# Patient Record
Sex: Female | Born: 1980 | Race: White | Hispanic: No | Marital: Single | Smoking: Never smoker
Health system: Southern US, Community
[De-identification: ages and names within clinical notes are randomized; demographics above are authoritative.]

## PROBLEM LIST (undated history)

## (undated) DIAGNOSIS — J45909 Unspecified asthma, uncomplicated: Secondary | ICD-10-CM

## (undated) DIAGNOSIS — E282 Polycystic ovarian syndrome: Secondary | ICD-10-CM

## (undated) DIAGNOSIS — T7840XA Allergy, unspecified, initial encounter: Secondary | ICD-10-CM

## (undated) DIAGNOSIS — G43909 Migraine, unspecified, not intractable, without status migrainosus: Secondary | ICD-10-CM

## (undated) HISTORY — DX: Polycystic ovarian syndrome: E28.2

## (undated) HISTORY — DX: Migraine, unspecified, not intractable, without status migrainosus: G43.909

## (undated) HISTORY — PX: WISDOM TOOTH EXTRACTION: SHX21

## (undated) HISTORY — DX: Allergy, unspecified, initial encounter: T78.40XA

## (undated) HISTORY — DX: Unspecified asthma, uncomplicated: J45.909

---

## 2011-03-28 ENCOUNTER — Emergency Department (HOSPITAL_COMMUNITY)
Admission: EM | Admit: 2011-03-28 | Discharge: 2011-03-29 | Disposition: A | Discharge status: Home / Self Care | Payer: BC Managed Care – PPO | Attending: Emergency Medicine | Admitting: Emergency Medicine

## 2011-03-28 DIAGNOSIS — L500 Allergic urticaria: Secondary | ICD-10-CM | POA: Insufficient documentation

## 2011-03-28 DIAGNOSIS — R6889 Other general symptoms and signs: Secondary | ICD-10-CM | POA: Insufficient documentation

## 2011-03-28 DIAGNOSIS — L298 Other pruritus: Secondary | ICD-10-CM | POA: Insufficient documentation

## 2011-03-28 DIAGNOSIS — L2989 Other pruritus: Secondary | ICD-10-CM | POA: Insufficient documentation

## 2011-07-06 ENCOUNTER — Ambulatory Visit (INDEPENDENT_AMBULATORY_CARE_PROVIDER_SITE_OTHER): Payer: BC Managed Care – PPO

## 2011-07-06 DIAGNOSIS — R1013 Epigastric pain: Secondary | ICD-10-CM

## 2011-07-06 DIAGNOSIS — R1084 Generalized abdominal pain: Secondary | ICD-10-CM

## 2011-08-13 ENCOUNTER — Ambulatory Visit (INDEPENDENT_AMBULATORY_CARE_PROVIDER_SITE_OTHER): Payer: BC Managed Care – PPO

## 2011-08-13 DIAGNOSIS — R059 Cough, unspecified: Secondary | ICD-10-CM

## 2011-08-13 DIAGNOSIS — J069 Acute upper respiratory infection, unspecified: Secondary | ICD-10-CM

## 2011-08-13 DIAGNOSIS — R05 Cough: Secondary | ICD-10-CM

## 2011-08-13 DIAGNOSIS — N760 Acute vaginitis: Secondary | ICD-10-CM

## 2012-03-24 ENCOUNTER — Ambulatory Visit (INDEPENDENT_AMBULATORY_CARE_PROVIDER_SITE_OTHER): Payer: BC Managed Care – PPO | Admitting: Physician Assistant

## 2012-03-24 VITALS — BP 134/85 | HR 109 | Temp 97.3°F | Resp 16 | Ht 63.0 in | Wt 145.0 lb

## 2012-03-24 DIAGNOSIS — E282 Polycystic ovarian syndrome: Secondary | ICD-10-CM

## 2012-03-24 DIAGNOSIS — N898 Other specified noninflammatory disorders of vagina: Secondary | ICD-10-CM

## 2012-03-24 LAB — POCT WET PREP WITH KOH: Trichomonas, UA: NEGATIVE

## 2012-03-24 MED ORDER — AMBULATORY NON FORMULARY MEDICATION: 1.0000 | Status: DC

## 2012-03-24 MED ORDER — TERCONAZOLE 0.4 % VA CREA: 1.0000 | TOPICAL_CREAM | Freq: Every day | VAGINAL | Status: DC

## 2012-03-24 MED ORDER — CLINDAMYCIN PHOSPHATE 2 % VA CREA: 1.0000 | TOPICAL_CREAM | Freq: Every day | VAGINAL | Status: AC

## 2012-03-24 NOTE — Progress Notes (Signed)
  Subjective:    Patient ID: Daisy Miller, female    DOB: [birth year], 31 y.o.   MRN: [id number]  HPI Pt presents to clinic with vaginal d/c and odor.  Regular gyn is Dr. Chevis Pretty who has been working with her about PCOS and problems with oral contraceptives and depression.  She had a Skyla IUD placed in 2/13 and has had spotting since its insertion.  She has had multiple episodes of BV and yeast vaginitis.  Her last episode of these was about 3 wks ago.  She now has vaginal d/c and odor.  No vaginal irritated or pain. She is not and has never been sexually active.   Review of Systems  Genitourinary: Positive for vaginal bleeding and vaginal discharge. Negative for dysuria, urgency, difficulty urinating and vaginal pain.       Objective:   Physical Exam  Vitals reviewed. Constitutional: She is oriented to person, place, and time. She appears well-developed and well-nourished.  HENT:  Head: Normocephalic and atraumatic.  Right Ear: External ear normal.  Left Ear: External ear normal.  Pulmonary/Chest: Effort normal.  Genitourinary: Pelvic exam was performed with patient supine. No labial fusion. There is no rash, tenderness, lesion or injury on the right labia. There is no rash, tenderness, lesion or injury on the left labia. Cervix exhibits no motion tenderness and no discharge. No bleeding around the vagina. Vaginal discharge (white) found.  Neurological: She is alert and oriented to person, place, and time.  Skin: Skin is warm and dry.  Psychiatric: She has a normal mood and affect. Her behavior is normal. Judgment and thought content normal.    Results for orders placed in visit on 03/24/12  POCT WET PREP WITH KOH      Component Value Range   Trichomonas, UA Negative     Clue Cells Wet Prep HPF POC 2-4     Epithelial Wet Prep HPF POC 8-10     Yeast Wet Prep HPF POC negative     Bacteria Wet Prep HPF POC 1+     RBC Wet Prep HPF POC 0-2     WBC Wet Prep HPF POC 1-4     KOH Prep POC  Negative           1. Vaginal Discharge  POCT Wet Prep with KOH, terconazole (TERAZOL 7) 0.4 % vaginal cream, clindamycin (CLEOCIN) 2 % vaginal cream, AMBULATORY NON FORMULARY MEDICATION, DISCONTINUED: AMBULATORY NON FORMULARY MEDICATION   D/w pt several possibilities for treatment.  Pt will call Dr. Chevis Pretty tomorrow with results of wet prep and different medication choices and together they will make the decision due to her long h/o of these problems.  I really think that Boric Acid might help because I think that this is related to her spotting 2nd to the [facility] IUD and her pH changing as a result of that.  Pt's questions answered and she understands and agrees with the above. Assessment & Plan:

## 2012-06-25 ENCOUNTER — Ambulatory Visit (INDEPENDENT_AMBULATORY_CARE_PROVIDER_SITE_OTHER): Payer: BC Managed Care – PPO | Admitting: Family Medicine

## 2012-06-25 VITALS — BP 114/78 | HR 114 | Temp 97.9°F | Resp 18 | Ht 65.75 in | Wt 142.0 lb

## 2012-06-25 DIAGNOSIS — N898 Other specified noninflammatory disorders of vagina: Secondary | ICD-10-CM

## 2012-06-25 LAB — POCT WET PREP WITH KOH
Clue Cells Wet Prep HPF POC: NEGATIVE
Trichomonas, UA: NEGATIVE
Yeast Wet Prep HPF POC: NEGATIVE

## 2012-06-25 MED ORDER — CLINDAMYCIN PHOSPHATE 2 % VA CREA: 1.0000 | TOPICAL_CREAM | Freq: Every day | VAGINAL | Status: DC

## 2012-06-25 NOTE — Progress Notes (Signed)
  Subjective:    Patient ID: Daisy Miller, female    DOB: 1981-06-24, 31 y.o.   MRN: [id number]  HPI Pt presents to clinic with vaginal d/c and odor.  Was treated 3 months ago for similar type problem. Patient was also treated by her primary gynecologist recently for similar problem. Patient states that this was approximately 3 weeks ago.   Regular gyn is Dr. Chevis Pretty who has been working with her about PCOS and problems with oral contraceptives and depression.  She had a Skyla IUD placed in 2/13 and has had spotting since its insertion.  She has had multiple episodes of BV and yeast vaginitis.  Her last episode of these was about 3 wks ago as stated above.  She now has vaginal d/c and odor.  No vaginal irritated or pain. She is not and has never been sexually active.   Review of Systems  Genitourinary: Positive for vaginal bleeding and vaginal discharge. Negative for dysuria, urgency, difficulty urinating and vaginal pain.      Objective:   Physical Exam  Vitals reviewed. Constitutional: She is oriented to person, place, and time. She appears well-developed and well-nourished.  HENT:  Head: Normocephalic and atraumatic.  Right Ear: External ear normal.  Left Ear: External ear normal.  Pulmonary/Chest: Effort normal.  Genitourinary: Pelvic exam was performed with patient supine. No labial fusion. There is no rash, tenderness, lesion or injury on the right labia. There is no rash, tenderness, lesion or injury on the left labia. Cervix exhibits no motion tenderness and no discharge. No bleeding around the vagina. Vaginal discharge (white) found.  Neurological: She is alert and oriented to person, place, and time.  Skin: Skin is warm and dry.  Psychiatric: She has a normal mood and affect. Her behavior is normal. Judgment and thought content normal.    Results for orders placed in visit on 03/24/12  POCT WET PREP WITH KOH      Component Value Range   Trichomonas, UA Negative     Clue Cells  Wet Prep HPF POC 2-4     Epithelial Wet Prep HPF POC 8-10     Yeast Wet Prep HPF POC negative     Bacteria Wet Prep HPF POC 1+     RBC Wet Prep HPF POC 0-2     WBC Wet Prep HPF POC 1-4     KOH Prep POC Negative      Assessment: Recurrent vaginal discharge with likely bacterial vaginosis.     Will refill patient's clindamycin She will followup with her primary gynecologist.  Pt's questions answered and she understands and agrees with the above. Assessment & Plan:

## 2012-06-25 NOTE — Patient Instructions (Signed)
Bacterial Vaginosis Bacterial vaginosis (BV) is a vaginal infection where the normal balance of bacteria in the vagina is disrupted. The normal balance is then replaced by an overgrowth of certain bacteria. There are several different kinds of bacteria that can cause BV. BV is the most common vaginal infection in women of childbearing age. CAUSES   The cause of BV is not fully understood. BV develops when there is an increase or imbalance of harmful bacteria.  Some activities or behaviors can upset the normal balance of bacteria in the vagina and put women at increased risk including:  Having a new sex partner or multiple sex partners.  Douching.  Using an intrauterine device (IUD) for contraception.  It is not clear what role sexual activity plays in the development of BV. However, women that have never had sexual intercourse are rarely infected with BV. Women do not get BV from toilet seats, bedding, swimming pools or from touching objects around them.  SYMPTOMS   Grey vaginal discharge.  A fish-like odor with discharge, especially after sexual intercourse.  Itching or burning of the vagina and vulva.  Burning or pain with urination.  Some women have no signs or symptoms at all. DIAGNOSIS  Your caregiver must examine the vagina for signs of BV. Your caregiver will perform lab tests and look at the sample of vaginal fluid through a microscope. They will look for bacteria and abnormal cells (clue cells), a pH test higher than 4.5, and a positive amine test all associated with BV.  RISKS AND COMPLICATIONS   Pelvic inflammatory disease (PID).  Infections following gynecology surgery.  Developing HIV.  Developing herpes virus. TREATMENT  Sometimes BV will clear up without treatment. However, all women with symptoms of BV should be treated to avoid complications, especially if gynecology surgery is planned. Female partners generally do not need to be treated. However, BV may spread  between female sex partners so treatment is helpful in preventing a recurrence of BV.   BV may be treated with antibiotics. The antibiotics come in either pill or vaginal cream forms. Either can be used with nonpregnant or pregnant women, but the recommended dosages differ. These antibiotics are not harmful to the baby.  BV can recur after treatment. If this happens, a second round of antibiotics will often be prescribed.  Treatment is important for pregnant women. If not treated, BV can cause a premature delivery, especially for a pregnant woman who had a premature birth in the past. All pregnant women who have symptoms of BV should be checked and treated.  For chronic reoccurrence of BV, treatment with a type of prescribed gel vaginally twice a week is helpful. HOME CARE INSTRUCTIONS   Finish all medication as directed by your caregiver.  Do not have sex until treatment is completed.  Tell your sexual partner that you have a vaginal infection. They should see their caregiver and be treated if they have problems, such as a mild rash or itching.  Practice safe sex. Use condoms. Only have 1 sex partner. PREVENTION  Basic prevention steps can help reduce the risk of upsetting the natural balance of bacteria in the vagina and developing BV:  Do not have sexual intercourse (be abstinent).  Do not douche.  Use all of the medicine prescribed for treatment of BV, even if the signs and symptoms go away.  Tell your sex partner if you have BV. That way, they can be treated, if needed, to prevent reoccurrence. SEEK MEDICAL CARE IF:     Your symptoms are not improving after 3 days of treatment.  You have increased discharge, pain, or fever. MAKE SURE YOU:   Understand these instructions.  Will watch your condition.  Will get help right away if you are not doing well or get worse. FOR MORE INFORMATION  Division of STD Prevention (DSTDP), Centers for Disease Control and Prevention:  www.cdc.gov/std American Social Health Association (ASHA): www.ashastd.org  Document Released: 07/02/2005 Document Revised: 09/24/2011 Document Reviewed: 12/23/2008 Exit[employer] Patient Information 2013 [employer].  

## 2012-09-30 ENCOUNTER — Ambulatory Visit (INDEPENDENT_AMBULATORY_CARE_PROVIDER_SITE_OTHER): Payer: BC Managed Care – PPO | Admitting: Family Medicine

## 2012-09-30 VITALS — BP 125/78 | HR 98 | Temp 97.3°F | Resp 16 | Ht 63.0 in | Wt 146.0 lb

## 2012-09-30 DIAGNOSIS — L237 Allergic contact dermatitis due to plants, except food: Secondary | ICD-10-CM

## 2012-09-30 DIAGNOSIS — L255 Unspecified contact dermatitis due to plants, except food: Secondary | ICD-10-CM

## 2012-09-30 DIAGNOSIS — Z87898 Personal history of other specified conditions: Secondary | ICD-10-CM

## 2012-09-30 MED ORDER — EPINEPHRINE 0.3 MG/0.3ML IJ DEVI: 0.3000 mg | Freq: Once | INTRAMUSCULAR | Status: DC

## 2012-09-30 MED ORDER — CEPHALEXIN 500 MG PO CAPS: 500.0000 mg | ORAL_CAPSULE | Freq: Two times a day (BID) | ORAL | Status: DC

## 2012-09-30 NOTE — Patient Instructions (Addendum)
Use OTC zantac and zyrtec (or claritin or allegra) once a day as directed.  Use the antibiotic as directed as well.  You can use any topical agents that seem to be helpful for your poison ivy.  You will probably start to improve in the next few days.  Try to stay cool, and use the topical hydrocortisone cream as needed as long as you continue to tolerate it.  Let us know if you are not continuing to get better in the next few days.  Also be sure you are not continuing to come in contact with the plant oil (on towels, sheets, etc).

## 2012-09-30 NOTE — Progress Notes (Addendum)
[facility] [address] [place] [address] [phone]  Date:  09/30/2012   Name:  Daisy Miller   DOB:  10-13-1980   MRN:  [id number]  PCP:  No primary provider on file.    Chief Complaint: Rash   History of Present Illness:  Daisy Miller is a 32 y.o. very pleasant female patient who presents with the following:  She is here with possible poison ivy on both arms for one week.  She did some yard work 2 weeks ago and felt certain that she removed a large amount of poison ivy from her yard.  About one week later she noted onset of an itchy rash on both arms.  The rash is mostly confined to her arms as she was wearing long pants and gloves, and only pushed her sleeves up a couple of times when she felt hot. She has used caladryl as needed, and has tried topical hydrocortisone.  The area feels warm, and is a bit swollen.  She is starting to become concerned about superinfection so she came in for evaluation today.    She has also tried PO benadryl.  The benadryl caused her to sleep poorly so she did not continue to use it.  She woke up frequently when she took the benadryl and was tossing and turning.   She is generally healthy except for PCOS.   She has used a topical hydrocortisone cream- proctosol HC-  in the past which gave her a severe rash.  However, this was thought to be due to ?the paraben in the cream.  She is currently using another OTC hydrocortisone preparation which she does tolerate.    She has never taken oral steroids as far as she knows. The itching on her arms is waking her up frequently at night. However, she does not want to use PO steroids unless necessary  She has an IUD for contraception.     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  She would like a refill of her epi- pen as her old pen has expired.  She has not needed to use her pen, but wants to have a current pen available   Patient Active Problem List   Diagnosis  . PCOS (polycystic ovarian syndrome)    Past Medical History  Diagnosis Date  . Allergy   . Asthma   . Migraines   . PCOS (polycystic ovarian syndrome)     Past Surgical History  Procedure Laterality Date  . Wisdom tooth extraction      History  Substance Use Topics  . Smoking status: Never Smoker   . Smokeless tobacco: Not on file  . Alcohol Use: 0.6 oz/week    1 Cans of beer per week    Family History  Problem Relation Age of Onset  . Cancer Mother 24    uterine  . Diabetes Father   . Cancer Maternal Grandmother   . Cancer Paternal Grandmother   . Cancer Paternal Grandfather     Allergies  Allergen Reactions  . Latex   . Camila (Norethindrone)   . Diflucan (Fluconazole)   . Medroxyprogesterone   . Proctosol (Hydrocortisone)   . Sprintec 28 (Norgestimate-Eth Estradiol) Other (See Comments)    Suicidal thoughts     Medication list has been reviewed and updated.  Current Outpatient Prescriptions on File Prior to Visit  Medication Sig Dispense Refill  . clindamycin (CLEOCIN) 2 % vaginal cream Place  1 Applicatorful vaginally at bedtime.  40 g  0  . ferrous sulfate 325 (65 FE) MG tablet Take 325 mg by mouth daily with breakfast.      . Levonorgestrel (SKYLA) 13.5 MG IUD by Intrauterine route.      . Multiple Vitamins-Minerals (MULTIVITAMIN WITH MINERALS) tablet Take 1 tablet by mouth daily.      . Probiotic Product (PROBIOTIC & ACIDOPHILUS EX ST PO) Take by mouth.      . AMBULATORY NON FORMULARY MEDICATION Place 1 capsule vaginally as directed. Boric Acid suppositories - 600mg  3x/wk for 2 wks, 2x/wk for 2 wks, 1x/week for 2 wks  30 capsule  0   No current facility-administered medications on file prior to visit.    Review of Systems:  As per HPI- otherwise negative.   Physical Examination: Filed Vitals:   09/30/12 1154  BP: 125/78  Pulse: 98  Temp: 97.3 F (36.3 C)  Resp: 16   Filed Vitals:   09/30/12 1154  Height: 5\' 3"  (1.6 m)   Weight: 146 lb (66.225 kg)   Body mass index is 25.87 kg/(m^2). Ideal Body Weight: Weight in (lb) to have BMI = 25: 140.8  GEN: WDWN, NAD, Non-toxic, A & O x 3 HEENT: Atraumatic, Normocephalic. Neck supple. No masses, No LAD.  No oral lesions, no angioedema Ears and Nose: No external deformity. CV: RRR, No M/G/R. No JVD. No thrill. No extra heart sounds. PULM: CTA B, no wheezes, crackles, rhonchi. No retractions. No resp. distress. No accessory muscle use. EXTR: No c/c/e.  Typical PI rash on both arms, with slight redness and some crusting  NEURO Normal gait.  PSYCH: Normally interactive. Conversant. Not depressed or anxious appearing.  Calm demeanor.   Assessment and Plan: History of angioedema - Plan: EPINEPHrine (EPIPEN) 0.3 mg/0.3 mL DEVI  Poison ivy - Plan: cephALEXin (KEFLEX) 500 MG capsule  Abbiegail is here with rhus dermatitis on both arms.  She does not necessarily want steroids- she is more worried that she might be developing an infection.  Will use keflex, and zantac and an OTC antihistamine such as zyrtec as nedded.  Benadryl caused her to become too activated at night so she will avoid this medication if possible.   If she does not get better we can use prednisone if needed- unsure what to make of her topical hydrocortisone reaction in the past, but suspect it was due to another ingredient in the cream.    See patient instructions for more details.    Signed Abbe Amsterdam, MD  Called her to clarify with her that zantac and zyrtec/ claritin/ allegra are antihistamines like benadryl, but of a different generation or type. If benadryl caused just activation and difficulty with sleeping then she should be fine to try zantac and zyrtec.  However, if any other reaction to benadryl occurred she should not use these medications and call me.   LMOM as there was no answer. Benadryl not on her allergy list which was reviewed today.

## 2012-10-06 ENCOUNTER — Telehealth: Payer: Self-pay | Admitting: Family Medicine

## 2012-10-06 NOTE — Telephone Encounter (Signed)
Called her- let her know I accidentally gave her #40 instead of #20 keflex pills- she will stop after she has finished 20.  She states her poison ivy is better.  She will let us know if we can do anything else to help

## 2012-10-19 ENCOUNTER — Ambulatory Visit (INDEPENDENT_AMBULATORY_CARE_PROVIDER_SITE_OTHER): Payer: BC Managed Care – PPO | Admitting: Physician Assistant

## 2012-10-19 VITALS — BP 123/78 | HR 86 | Temp 98.0°F | Resp 16 | Ht 64.0 in | Wt 150.0 lb

## 2012-10-19 DIAGNOSIS — Z2089 Contact with and (suspected) exposure to other communicable diseases: Secondary | ICD-10-CM

## 2012-10-19 DIAGNOSIS — Z20818 Contact with and (suspected) exposure to other bacterial communicable diseases: Secondary | ICD-10-CM

## 2012-10-19 DIAGNOSIS — J029 Acute pharyngitis, unspecified: Secondary | ICD-10-CM

## 2012-10-19 MED ORDER — AMOXICILLIN 875 MG PO TABS: 875.0000 mg | ORAL_TABLET | Freq: Two times a day (BID) | ORAL | Status: DC

## 2012-10-19 NOTE — Progress Notes (Signed)
  Subjective:    Patient ID: Daisy Miller, female    DOB: 23-Oct-1980, 32 y.o.   MRN: [id number]  HPI   Daisy Miller is a pleasant 32 yr old female here with concern for strep throat.  Began having a sore throat yesterday.  Also experiencing chills and low grade fever.  Tmax 100.72F, but pt states this is "really really high" for her.  States her baseline is 97.85F.  Looked her throat and it was "gross".  Throat pain has been worsening today.  Also has a little headache.  Has had runny nose for a couple of days but thinks this is maybe from poison ivy (?)  Denies cough or GI symptoms.  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     Review of Systems  Constitutional: Positive for fever and chills.  HENT: Positive for sore throat.   Respiratory: Negative for cough, shortness of breath and wheezing.   Cardiovascular: Negative.   Gastrointestinal: Negative.   Musculoskeletal: Negative.   Skin: Negative.   Neurological: Negative.        Objective:   Physical Exam  Vitals reviewed. Constitutional: She is oriented to person, place, and time. She appears well-developed. No distress.  HENT:  Head: Normocephalic and atraumatic.  Right Ear: Tympanic membrane and ear canal normal.  Left Ear: Tympanic membrane and ear canal normal.  Mouth/Throat: Uvula is midline and mucous membranes are normal. Oropharyngeal exudate (,minimal) and posterior oropharyngeal erythema present. No posterior oropharyngeal edema or tonsillar abscesses.  Eyes: Conjunctivae are normal. No scleral icterus.  Neck: Neck supple.  Cardiovascular: Normal rate, regular rhythm and normal heart sounds.  Exam reveals no gallop and no friction rub.   No murmur heard. Pulmonary/Chest: Effort normal and breath sounds normal. She has no wheezes. She has no rales.  Lymphadenopathy:    She has cervical adenopathy (tender).  Neurological: She is alert and oriented to person, place, and time. Coordination abnormal.   Skin: Skin is warm and dry.  Psychiatric: She has a normal mood and affect. Her behavior is normal.     Filed Vitals:   10/19/12 1812  BP: 123/78  Pulse: 86  Temp: 98 F (36.7 C)  Resp: 16     Results for orders placed in visit on 10/19/12  POCT RAPID STREP A (OFFICE)      Result Value Range   Rapid Strep A Screen Negative  Negative       Assessment & Plan:  Sore throat - Plan: POCT rapid strep A, Culture, Group A Strep, amoxicillin (AMOXIL) 875 MG tablet  Exposure to strep throat - Plan: POCT rapid strep A, Culture, Group A Strep, amoxicillin (AMOXIL) 875 MG tablet   Daisy Miller is a pleasant 32 yr old female here with sore throat and exposure to strep.  On exam she has low grade fever and tender anterior cervical adenopathy.  Rapid strep is negative.  Culture sent.  Discussed with pt that I think it would be reasonable to start abx today, but I also think it would be reasonable to wait for culture results.  I have sent amox to the pharmacy, she can start this if worsening or if cx pos.  If cx neg, will stop abx.  Supportive care in the mean time.  Tylenol/Advil for fever/pain.  Pt will let us know if worsening or not improving.

## 2012-10-19 NOTE — Patient Instructions (Addendum)
Your rapid strep is negative today.  I will let you know when the culture results are back.  I have sent amoxicillin to your pharmacy - you can start this today, or you can wait until we have the final culture results.  Plenty of fluids (water is best!), warm things like tea or coffee are often soothing.  Warm salt water gargles will help.  You may alternate Tylenol and Motrin every 4 hours if needed for fever, throat pain.  If you are worsening or not improving, please let me know.

## 2012-10-22 LAB — CULTURE, GROUP A STREP: Organism ID, Bacteria: NORMAL

## 2013-05-07 ENCOUNTER — Ambulatory Visit (INDEPENDENT_AMBULATORY_CARE_PROVIDER_SITE_OTHER): Payer: BC Managed Care – PPO | Admitting: Family Medicine

## 2013-05-07 VITALS — BP 108/68 | HR 80 | Temp 98.3°F | Resp 16 | Ht 62.5 in | Wt 152.6 lb

## 2013-05-07 DIAGNOSIS — J019 Acute sinusitis, unspecified: Secondary | ICD-10-CM

## 2013-05-07 DIAGNOSIS — J029 Acute pharyngitis, unspecified: Secondary | ICD-10-CM

## 2013-05-07 LAB — POCT RAPID STREP A (OFFICE): Rapid Strep A Screen: NEGATIVE

## 2013-05-07 MED ORDER — AMOXICILLIN-POT CLAVULANATE 875-125 MG PO TABS: 1.0000 | ORAL_TABLET | Freq: Two times a day (BID) | ORAL | Status: DC

## 2013-05-07 NOTE — Patient Instructions (Signed)

## 2013-05-07 NOTE — Progress Notes (Signed)
[facility]:  Office Visit  Chief Complaint:  Chief Complaint  Patient presents with  . Cough    x 1 week   . Sore Throat    x 1 week   . Chest Pain    with cough x 1 week     HPI: Daisy Miller is a 32 y.o. female who is here for  1 week history of cough, laryngitis, clear sputum, when she blows her nose she has some tinge of blood.  Deneis fevers, + chills. Chest pain in midsternum when she coughed initially but now she has it constantly even without cough, Deneis facial pain, has some ear pain on left side. She had childhood asthma, last asthma attack was age 61. She has seasonal allergies. Took cough drops, tea with lemon, and honey, water and orange juice, and rest.  Denies msk aches, but just with coughing.  Has not had flu vaccine, does not want it.  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 Allergy   . Asthma   . Migraines   . PCOS (polycystic ovarian syndrome)    Past Surgical History  Procedure Laterality Date  . Wisdom tooth extraction     History   Social History  . Marital Status: Single    Spouse Name: N/A    Number of Children: N/A  . Years of Education: N/A   Social History Main Topics  . Smoking status: Never Smoker   . Smokeless tobacco: None  . Alcohol Use: 0.6 oz/week    1 Cans of beer per week  . Drug Use: No  . Sexual Activity: No   Other Topics Concern  . None   Social History Narrative  . None   Family History  Problem Relation Age of Onset  . Cancer Mother 23    uterine  . Diabetes Father   . Heart disease Father   . Cancer Maternal Grandmother   . Cancer Paternal Grandmother   . Cancer Paternal Grandfather   . Heart disease Paternal Grandfather    Allergies  Allergen Reactions  . Latex   . Camila [Norethindrone]   . Diflucan [Fluconazole]   . Medroxyprogesterone   . Proctosol [Hydrocortisone]   .  Sprintec 28 [Norgestimate-Eth Estradiol] Other (See Comments)    Suicidal thoughts    Prior to Admission medications   Medication Sig Start Date End Date Taking? Authorizing Provider  EPINEPHrine (EPIPEN) 0.3 mg/0.3 mL DEVI Inject 0.3 mLs (0.3 mg total) into the muscle once. 09/30/12  Yes Gwenlyn Found Copland, MD  Levonorgestrel (SKYLA) 13.5 MG IUD by Intrauterine route.   Yes Historical Provider, MD  Multiple Vitamins-Minerals (MULTIVITAMIN WITH MINERALS) tablet Take 1 tablet by mouth daily.   Yes Historical Provider, MD  amoxicillin (AMOXIL) 875 MG tablet Take 1 tablet (875 mg total) by mouth 2 (two) times daily. 10/19/12   Eleanore Delia Chimes, PA-C  cetirizine (ZYRTEC) 10 MG tablet Take 10 mg by mouth daily.    Historical Provider, MD  Probiotic Product (PROBIOTIC & ACIDOPHILUS EX ST PO) Take by mouth.    Historical Provider, MD     ROS: The patient denies fevers, , night sweats, unintentional weight loss, chest pain, palpitations, wheezing, dyspnea on exertion, nausea, vomiting, abdominal pain, dysuria, hematuria, melena, numbness, weakness, or tingling.   All other systems have been reviewed and were otherwise negative with the exception of  those mentioned in the HPI and as above.    PHYSICAL EXAM: Filed Vitals:   05/07/13 1808  BP: 108/68  Pulse: 80  Temp: 98.3 F (36.8 C)  Resp: 16   Filed Vitals:   05/07/13 1808  Height: 5' 2.5" (1.588 m)  Weight: 152 lb 9.6 oz (69.219 kg)   Body mass index is 27.45 kg/(m^2).  General: Alert, no acute distress HEENT:  Normocephalic, atraumatic, oropharynx patent. EOMI, PERRLA, TM nl, + dc on right tonsil, + right sinus max tenderness Cardiovascular:  Regular rate and rhythm, no rubs murmurs or gallops.  No Carotid bruits, radial pulse intact. No pedal edema.  Respiratory: Clear to auscultation bilaterally.  No wheezes, rales, or rhonchi.  No cyanosis, no use of accessory musculature GI: No organomegaly, abdomen is soft and non-tender, positive  bowel sounds.  No masses. Skin: No rashes. Neurologic: Facial musculature symmetric. Psychiatric: Patient is appropriate throughout our interaction. Lymphatic: No cervical lymphadenopathy Musculoskeletal: Gait intact.   LABS: Results for orders placed in visit on 05/07/13  POCT RAPID STREP A (OFFICE)      Result Value Range   Rapid Strep A Screen Negative  Negative     EKG/XRAY:   Primary read interpreted by Dr. Conley Rolls at [facility].   ASSESSMENT/PLAN: Encounter Diagnoses  Name Primary?  . Acute pharyngitis Yes  . Acute sinusitis    Well appearing female most likely viral vs bacterial sinusitis Sx treatment OTC cough meds and nasal sprays Rx Augmentin Will call in diflucan if she needs it, she has taken it before and had a rechallenge and ahd no adverse effects F/u prn Gross sideeffects, risk and benefits, and alternatives of medications d/w patient. Patient is aware that all medications have potential sideeffects and we are unable to predict every sideeffect or drug-drug interaction that may occur.  Stevin Bielinski PHUONG, DO 05/07/2013 7:11 PM

## 2013-08-06 ENCOUNTER — Ambulatory Visit (INDEPENDENT_AMBULATORY_CARE_PROVIDER_SITE_OTHER): Payer: BC Managed Care – PPO | Admitting: Family Medicine

## 2013-08-06 VITALS — BP 100/66 | HR 76 | Temp 98.1°F | Resp 16 | Ht 64.0 in | Wt 157.4 lb

## 2013-08-06 DIAGNOSIS — B3731 Acute candidiasis of vulva and vagina: Secondary | ICD-10-CM

## 2013-08-06 DIAGNOSIS — B373 Candidiasis of vulva and vagina: Secondary | ICD-10-CM

## 2013-08-06 DIAGNOSIS — N898 Other specified noninflammatory disorders of vagina: Secondary | ICD-10-CM

## 2013-08-06 LAB — POCT WET PREP WITH KOH
KOH Prep POC: POSITIVE
Trichomonas, UA: NEGATIVE
Yeast Wet Prep HPF POC: POSITIVE

## 2013-08-06 MED ORDER — TERCONAZOLE 0.4 % VA CREA: 1.0000 | TOPICAL_CREAM | Freq: Every day | VAGINAL | Status: DC

## 2013-08-06 MED ORDER — TERCONAZOLE 0.4 % VA CREA: 1.0000 | TOPICAL_CREAM | Freq: Every day | VAGINAL | Status: AC

## 2013-08-06 NOTE — Progress Notes (Signed)
Chief Complaint:  Chief Complaint  Patient presents with  . Vaginal Discharge    few weeks  . vaginal odor    HPI: Daisy Miller is a 33 y.o. female who is here for vaginal discharge, vaginal odor, itching. Symptoms started July 20, 2013. She has a history of BV and yeast infections. During the Christmas holiday she was out of town and used the wrong soap. She states that soaps are a big factor causing the symptoms in the past. She has no complaints of fevers, chills, n/v/pe;vic pain, urination problems, no abdominal pain. She does not have any sexual partners and has no concern for any STDs. Does not want pelvic exam, just want self swab if possible.  Past Medical History  Diagnosis Date  . Allergy   . Asthma   . Migraines   . PCOS (polycystic ovarian syndrome)    Past Surgical History  Procedure Laterality Date  . Wisdom tooth extraction     History   Social History  . Marital Status: Single    Spouse Name: N/A    Number of Children: N/A  . Years of Education: N/A   Social History Main Topics  . Smoking status: Never Smoker   . Smokeless tobacco: None  . Alcohol Use: 0.6 oz/week    1 Cans of beer per week  . Drug Use: No  . Sexual Activity: No   Other Topics Concern  . None   Social History Narrative  . None   Family History  Problem Relation Age of Onset  . Cancer Mother 62    uterine  . Diabetes Father   . Heart disease Father   . Cancer Maternal Grandmother   . Cancer Paternal Grandmother   . Cancer Paternal Grandfather   . Heart disease Paternal Grandfather    Allergies  Allergen Reactions  . Latex   . Camila [Norethindrone]   . Diflucan [Fluconazole]   . Medroxyprogesterone   . Proctosol [Hydrocortisone]   . Sprintec 28 [Norgestimate-Eth Estradiol] Other (See Comments)    Suicidal thoughts    Prior to Admission medications   Medication Sig Start Date End Date Taking? Authorizing Provider  EPINEPHrine (EPIPEN) 0.3 mg/0.3 mL DEVI  Inject 0.3 mLs (0.3 mg total) into the muscle once. 09/30/12  Yes Gwenlyn Found Copland, MD  Levonorgestrel (SKYLA) 13.5 MG IUD by Intrauterine route.   Yes Historical Provider, MD  Multiple Vitamins-Minerals (MULTIVITAMIN WITH MINERALS) tablet Take 1 tablet by mouth daily.   Yes Historical Provider, MD  Probiotic Product (PROBIOTIC & ACIDOPHILUS EX ST PO) Take by mouth.   Yes Historical Provider, MD  amoxicillin-clavulanate (AUGMENTIN) 875-125 MG per tablet Take 1 tablet by mouth 2 (two) times daily. 05/07/13   Thao P Le, DO  cetirizine (ZYRTEC) 10 MG tablet Take 10 mg by mouth daily.    Historical Provider, MD     ROS: The patient denies fevers, chills, night sweats, unintentional weight loss, chest pain, palpitations, wheezing, dyspnea on exertion, nausea, vomiting, abdominal pain, dysuria, hematuria, melena, numbness, weakness, or tingling. + vaginal dc  All other systems have been reviewed and were otherwise negative with the exception of those mentioned in the HPI and as above.    PHYSICAL EXAM: Filed Vitals:   08/06/13 1745  BP: 100/66  Pulse: 76  Temp: 98.1 F (36.7 C)  Resp: 16   Filed Vitals:   08/06/13 1745  Height: 5\' 4"  (1.626 m)  Weight: 157 lb 6.4 oz (71.396 kg)  Body mass index is 27 kg/(m^2).  General: Alert, no acute distress HEENT:  Normocephalic, atraumatic, oropharynx patent. EOMI, PERRLA Cardiovascular:  Regular rate and rhythm, no rubs murmurs or gallops.  No Carotid bruits, radial pulse intact. No pedal edema.  Respiratory: Clear to auscultation bilaterally.  No wheezes, rales, or rhonchi.  No cyanosis, no use of accessory musculature GI: No organomegaly, abdomen is soft and non-tender, positive bowel sounds.  No masses. Skin: No rashes. Neurologic: Facial musculature symmetric. Psychiatric: Patient is appropriate throughout our interaction. Lymphatic: No cervical lymphadenopathy Musculoskeletal: Gait intact.   LABS: Results for orders placed in visit on  08/06/13  POCT WET PREP WITH KOH      Result Value Range   Trichomonas, UA Negative     Clue Cells Wet Prep HPF POC 0-1     Epithelial Wet Prep HPF POC 2-5     Yeast Wet Prep HPF POC positive     Bacteria Wet Prep HPF POC 1+     RBC Wet Prep HPF POC 0-1     WBC Wet Prep HPF POC 8-12     KOH Prep POC Positive       EKG/XRAY:   [facility]   ASSESSMENT/PLAN: Encounter Diagnoses  Name Primary?  . Vaginal discharge   . Candidiasis of vagina Yes   Rx Terazol x 3 days daily, 1 refill due to Diflucan intolerance Probiotics prn F/u prn  Gross sideeffects, risk and benefits, and alternatives of medications d/w patient. Patient is aware that all medications have potential sideeffects and we are unable to predict every sideeffect or drug-drug interaction that may occur.  Hamilton CapriLE, THAO PHUONG, DO 08/06/2013 7:13 PM

## 2013-12-22 ENCOUNTER — Other Ambulatory Visit: Payer: Self-pay | Admitting: Family Medicine

## 2013-12-23 NOTE — Telephone Encounter (Signed)
Dr Patsy Lager, I didn't know whether to send this to you or Dr Conley Rolls, since she has seen pt more recently, but not for this. Do you want to RF?

## 2014-02-22 ENCOUNTER — Ambulatory Visit (INDEPENDENT_AMBULATORY_CARE_PROVIDER_SITE_OTHER): Payer: BC Managed Care – PPO | Admitting: Family Medicine

## 2014-02-22 VITALS — BP 116/60 | HR 80 | Temp 97.9°F | Resp 16 | Ht 63.5 in | Wt 155.0 lb

## 2014-02-22 DIAGNOSIS — R350 Frequency of micturition: Secondary | ICD-10-CM

## 2014-02-22 DIAGNOSIS — R21 Rash and other nonspecific skin eruption: Secondary | ICD-10-CM

## 2014-02-22 DIAGNOSIS — R300 Dysuria: Secondary | ICD-10-CM

## 2014-02-22 DIAGNOSIS — N390 Urinary tract infection, site not specified: Secondary | ICD-10-CM

## 2014-02-22 DIAGNOSIS — N898 Other specified noninflammatory disorders of vagina: Secondary | ICD-10-CM

## 2014-02-22 LAB — POCT URINALYSIS DIPSTICK
Bilirubin, UA: NEGATIVE
Blood, UA: NEGATIVE
Glucose, UA: NEGATIVE
Ketones, UA: NEGATIVE
Nitrite, UA: NEGATIVE
Protein, UA: NEGATIVE
Spec Grav, UA: 1.015
Urobilinogen, UA: 0.2
pH, UA: 7.5

## 2014-02-22 LAB — POCT UA - MICROSCOPIC ONLY
Casts, Ur, LPF, POC: NEGATIVE
Crystals, Ur, HPF, POC: NEGATIVE
Mucus, UA: NEGATIVE
RBC, urine, microscopic: NEGATIVE
WBC, Ur, HPF, POC: NEGATIVE
Yeast, UA: NEGATIVE

## 2014-02-22 LAB — POCT WET PREP WITH KOH
KOH Prep POC: NEGATIVE
RBC Wet Prep HPF POC: NEGATIVE
Trichomonas, UA: NEGATIVE
Yeast Wet Prep HPF POC: NEGATIVE

## 2014-02-22 MED ORDER — KETOCONAZOLE 2 % EX CREA: 1.0000 "application " | TOPICAL_CREAM | Freq: Every day | CUTANEOUS | Status: DC

## 2014-02-22 MED ORDER — TRIAMCINOLONE ACETONIDE 0.1 % EX CREA
1.0000 | TOPICAL_CREAM | Freq: Two times a day (BID) | CUTANEOUS | Status: DC
Start: 2014-02-22 — End: 2016-05-22

## 2014-02-22 MED ORDER — CIPROFLOXACIN HCL 250 MG PO TABS: 250.0000 mg | ORAL_TABLET | Freq: Two times a day (BID) | ORAL | Status: DC

## 2014-02-22 NOTE — Progress Notes (Signed)
Chief Complaint:  Chief Complaint  Patient presents with  . Rash    x 2 weeks arm pits  . Vaginitis    x 2 weeks    HPI: Daisy Miller is a 33 y.o. female who is here for a 2 week hisotry of: 1. Rash underneath her arm pits that would not go away, she had a difuse rash but that went away when she stopped eating a type of chip . The rash underneath her armpit is burning when she puts underarm deodorant on it. She still shaves otherwise she has really back burning when the hairs grow out, she has not used any new meds, new soaps, new shaving cream, new deodrants. Same saop and alternating dieodaorants that she has always used  2. SHe ahs ahd vaginitis sxs, itching, burning, for last 2 weeks, she thought it was a yeast infection, she has been under a lot of stress, her dad was recently dx with mutlpiple myeloma nad she had to go back home ti [place] to help her mom.  She ahs ahd this before. She laso has ahd BV and cannot tolerate flagyl but can tolerate terazole. Deneos n/v/abd pain, feversor chills  3. There is also a rash on her arm which she thoguht was fungal and had put lamisil on it and it cleared up except for 1 spot. Was wondering what to do. She decribes it as a circle and had red dots around edge and 1 red dot in the middle. She had no fevers chill or tick bites with this   Past Medical History  Diagnosis Date  . Allergy   . Asthma   . Migraines   . PCOS (polycystic ovarian syndrome)    Past Surgical History  Procedure Laterality Date  . Wisdom tooth extraction     History   Social History  . Marital Status: Single    Spouse Name: N/A    Number of Children: N/A  . Years of Education: N/A   Social History Main Topics  . Smoking status: Never Smoker   . Smokeless tobacco: None  . Alcohol Use: 0.6 oz/week    1 Cans of beer per week  . Drug Use: No  . Sexual Activity: No   Other Topics Concern  . None   Social History Narrative  . None   Family  History  Problem Relation Age of Onset  . Cancer Mother 73    uterine  . Diabetes Father   . Heart disease Father   . Cancer Father   . Cancer Maternal Grandmother   . Cancer Paternal Grandmother   . Cancer Paternal Grandfather   . Heart disease Paternal Grandfather    Allergies  Allergen Reactions  . Latex   . Camila [Norethindrone]   . Diflucan [Fluconazole]   . Medroxyprogesterone   . Proctosol [Hydrocortisone]   . Sprintec 28 [Norgestimate-Eth Estradiol] Other (See Comments)    Suicidal thoughts    Prior to Admission medications   Medication Sig Start Date End Date Taking? Authorizing Provider  cetirizine (ZYRTEC) 10 MG tablet Take 10 mg by mouth daily.   Yes Historical Provider, MD  EPIPEN 2-PAK 0.3 MG/0.3ML SOAJ injection INJECT 1 DOSE INTO THE MUSCLE AS DIRECTED BY MD   Yes Gwenlyn Found Copland, MD  fluticasone (FLONASE) 50 MCG/ACT nasal spray Place 2 sprays into both nostrils daily.   Yes Historical Provider, MD  Levonorgestrel (SKYLA) 13.5 MG IUD by Intrauterine route.   Yes Historical  Provider, MD  Multiple Vitamins-Minerals (MULTIVITAMIN WITH MINERALS) tablet Take 1 tablet by mouth daily.    Historical Provider, MD  Probiotic Product (PROBIOTIC & ACIDOPHILUS EX ST PO) Take by mouth.    Historical Provider, MD     ROS: The patient denies fevers, chills, night sweats, unintentional weight loss, chest pain, palpitations, wheezing, dyspnea on exertion, nausea, vomiting, abdominal pain, dysuria, hematuria, melena, numbness, weakness, or tingling.   All other systems have been reviewed and were otherwise negative with the exception of those mentioned in the HPI and as above.    PHYSICAL EXAM: Filed Vitals:   02/22/14 1337  BP: 116/60  Pulse: 80  Temp: 97.9 F (36.6 C)  Resp: 16   Filed Vitals:   02/22/14 1337  Height: 5' 3.5" (1.613 m)  Weight: 155 lb (70.308 kg)   Body mass index is 27.02 kg/(m^2).  General: Alert, no acute distress HEENT:  Normocephalic,  atraumatic, oropharynx patent. EOMI, PERRLA Cardiovascular:  Regular rate and rhythm, no rubs murmurs or gallops.  No Carotid bruits, radial pulse intact. No pedal edema.  Respiratory: Clear to auscultation bilaterally.  No wheezes, rales, or rhonchi.  No cyanosis, no use of accessory musculature GI: No organomegaly, abdomen is soft and non-tender, positive bowel sounds.  No masses. Skin: ? Eczema vs less likelu candida rash, hard to tell since unable to see outline of rash; she is sweating perfusely and I cann;t see any rashes or sores on her axilla bialterally Neurologic: Facial musculature symmetric. Psychiatric: Patient is appropriate throughout our interaction. Lymphatic: No cervical lymphadenopathy Musculoskeletal: Gait intact. GU-vaginal dc, thick white-gray, minimally odorus,    LABS: Results for orders placed in visit on 02/22/14  POCT WET PREP WITH KOH      Result Value Ref Range   Trichomonas, UA Negative     Clue Cells Wet Prep HPF POC 1-3     Epithelial Wet Prep HPF POC 6-10     Yeast Wet Prep HPF POC neg     Bacteria Wet Prep HPF POC 1+     RBC Wet Prep HPF POC neg     WBC Wet Prep HPF POC 15-20     KOH Prep POC Negative    POCT UA - MICROSCOPIC ONLY      Result Value Ref Range   WBC, Ur, HPF, POC neg     RBC, urine, microscopic neg     Bacteria, U Microscopic trace     Mucus, UA neg     Epithelial cells, urine per micros 0-3     Crystals, Ur, HPF, POC neg     Casts, Ur, LPF, POC neg     Yeast, UA neg    POCT URINALYSIS DIPSTICK      Result Value Ref Range   Color, UA yellow     Clarity, UA cloudy     Glucose, UA neg     Bilirubin, UA neg     Ketones, UA neg     Spec Grav, UA 1.015     Blood, UA neg     pH, UA 7.5     Protein, UA neg     Urobilinogen, UA 0.2     Nitrite, UA neg     Leukocytes, UA small (1+)       EKG/XRAY:   Primary [facility]   ASSESSMENT/PLAN: Encounter Diagnoses  Name Primary?  . Rash Yes  . Vaginal  discharge   . Increased urinary frequency   .  Dysuria    UTI  cipro 250 mg BID x 3 days Urine cx pending  If no improvement then will treat for BV, she will have to be rx terazol Rx kenalog  And ketoconazole for possible eczema vs fungal dermatitis.She ahs tried otc clotrimazole already F/u prn    Gross sideeffects, risk and benefits, and alternatives of medications d/w patient. Patient is aware that all medications have potential sideeffects and we are unable to predict every sideeffect or drug-drug interaction that may occur.  Tawna Alwin PHUONG, DO 02/22/2014 3:29 PM

## 2014-02-24 LAB — URINE CULTURE: Colony Count: 50000

## 2014-02-25 ENCOUNTER — Telehealth: Payer: Self-pay | Admitting: Family Medicine

## 2014-02-25 NOTE — Telephone Encounter (Signed)
LM to call about labs

## 2014-04-19 ENCOUNTER — Ambulatory Visit (INDEPENDENT_AMBULATORY_CARE_PROVIDER_SITE_OTHER): Payer: BC Managed Care – PPO | Admitting: Psychology

## 2014-04-19 DIAGNOSIS — F332 Major depressive disorder, recurrent severe without psychotic features: Secondary | ICD-10-CM

## 2014-04-26 ENCOUNTER — Ambulatory Visit (INDEPENDENT_AMBULATORY_CARE_PROVIDER_SITE_OTHER): Payer: BC Managed Care – PPO | Admitting: Psychology

## 2014-04-26 DIAGNOSIS — F332 Major depressive disorder, recurrent severe without psychotic features: Secondary | ICD-10-CM

## 2014-05-10 ENCOUNTER — Ambulatory Visit (INDEPENDENT_AMBULATORY_CARE_PROVIDER_SITE_OTHER): Payer: BC Managed Care – PPO | Admitting: Psychology

## 2014-05-10 DIAGNOSIS — F332 Major depressive disorder, recurrent severe without psychotic features: Secondary | ICD-10-CM

## 2014-05-19 ENCOUNTER — Ambulatory Visit (INDEPENDENT_AMBULATORY_CARE_PROVIDER_SITE_OTHER): Payer: BC Managed Care – PPO | Admitting: Psychology

## 2014-05-19 DIAGNOSIS — F332 Major depressive disorder, recurrent severe without psychotic features: Secondary | ICD-10-CM

## 2014-05-26 ENCOUNTER — Ambulatory Visit (INDEPENDENT_AMBULATORY_CARE_PROVIDER_SITE_OTHER): Payer: BC Managed Care – PPO | Admitting: Psychology

## 2014-05-26 DIAGNOSIS — F332 Major depressive disorder, recurrent severe without psychotic features: Secondary | ICD-10-CM

## 2014-06-02 ENCOUNTER — Ambulatory Visit (INDEPENDENT_AMBULATORY_CARE_PROVIDER_SITE_OTHER): Payer: BC Managed Care – PPO | Admitting: Psychology

## 2014-06-02 DIAGNOSIS — F332 Major depressive disorder, recurrent severe without psychotic features: Secondary | ICD-10-CM

## 2014-06-07 ENCOUNTER — Ambulatory Visit: Payer: BC Managed Care – PPO | Admitting: Psychology

## 2014-06-28 ENCOUNTER — Ambulatory Visit (INDEPENDENT_AMBULATORY_CARE_PROVIDER_SITE_OTHER): Payer: BC Managed Care – PPO | Admitting: Psychology

## 2014-06-28 DIAGNOSIS — F332 Major depressive disorder, recurrent severe without psychotic features: Secondary | ICD-10-CM

## 2014-07-19 ENCOUNTER — Ambulatory Visit (INDEPENDENT_AMBULATORY_CARE_PROVIDER_SITE_OTHER): Payer: BC Managed Care – PPO | Admitting: Psychology

## 2014-07-19 DIAGNOSIS — F332 Major depressive disorder, recurrent severe without psychotic features: Secondary | ICD-10-CM

## 2014-08-02 ENCOUNTER — Ambulatory Visit (INDEPENDENT_AMBULATORY_CARE_PROVIDER_SITE_OTHER): Payer: BC Managed Care – PPO | Admitting: Psychology

## 2014-08-02 DIAGNOSIS — F332 Major depressive disorder, recurrent severe without psychotic features: Secondary | ICD-10-CM

## 2014-08-16 ENCOUNTER — Ambulatory Visit (INDEPENDENT_AMBULATORY_CARE_PROVIDER_SITE_OTHER): Payer: BC Managed Care – PPO | Admitting: Psychology

## 2014-08-16 DIAGNOSIS — F332 Major depressive disorder, recurrent severe without psychotic features: Secondary | ICD-10-CM

## 2014-08-30 ENCOUNTER — Ambulatory Visit (INDEPENDENT_AMBULATORY_CARE_PROVIDER_SITE_OTHER): Payer: BC Managed Care – PPO | Admitting: Psychology

## 2014-08-30 DIAGNOSIS — F332 Major depressive disorder, recurrent severe without psychotic features: Secondary | ICD-10-CM

## 2014-09-13 ENCOUNTER — Ambulatory Visit (INDEPENDENT_AMBULATORY_CARE_PROVIDER_SITE_OTHER): Payer: BC Managed Care – PPO | Admitting: Psychology

## 2014-09-13 DIAGNOSIS — F332 Major depressive disorder, recurrent severe without psychotic features: Secondary | ICD-10-CM

## 2014-09-27 ENCOUNTER — Ambulatory Visit (INDEPENDENT_AMBULATORY_CARE_PROVIDER_SITE_OTHER): Payer: BC Managed Care – PPO | Admitting: Psychology

## 2014-09-27 DIAGNOSIS — F332 Major depressive disorder, recurrent severe without psychotic features: Secondary | ICD-10-CM

## 2014-10-15 ENCOUNTER — Ambulatory Visit: Payer: BC Managed Care – PPO | Admitting: Psychology

## 2014-10-25 ENCOUNTER — Ambulatory Visit (INDEPENDENT_AMBULATORY_CARE_PROVIDER_SITE_OTHER): Payer: BC Managed Care – PPO | Admitting: Psychology

## 2014-10-25 DIAGNOSIS — F332 Major depressive disorder, recurrent severe without psychotic features: Secondary | ICD-10-CM

## 2014-11-08 ENCOUNTER — Ambulatory Visit (INDEPENDENT_AMBULATORY_CARE_PROVIDER_SITE_OTHER): Payer: BC Managed Care – PPO | Admitting: Psychology

## 2014-11-08 DIAGNOSIS — F332 Major depressive disorder, recurrent severe without psychotic features: Secondary | ICD-10-CM | POA: Diagnosis not present

## 2014-11-22 ENCOUNTER — Ambulatory Visit (INDEPENDENT_AMBULATORY_CARE_PROVIDER_SITE_OTHER): Payer: BC Managed Care – PPO | Admitting: Psychology

## 2014-11-22 DIAGNOSIS — F332 Major depressive disorder, recurrent severe without psychotic features: Secondary | ICD-10-CM

## 2014-12-06 ENCOUNTER — Ambulatory Visit (INDEPENDENT_AMBULATORY_CARE_PROVIDER_SITE_OTHER): Payer: BC Managed Care – PPO | Admitting: Psychology

## 2014-12-06 DIAGNOSIS — F332 Major depressive disorder, recurrent severe without psychotic features: Secondary | ICD-10-CM | POA: Diagnosis not present

## 2014-12-20 ENCOUNTER — Ambulatory Visit (INDEPENDENT_AMBULATORY_CARE_PROVIDER_SITE_OTHER): Payer: BC Managed Care – PPO | Admitting: Psychology

## 2014-12-20 DIAGNOSIS — F332 Major depressive disorder, recurrent severe without psychotic features: Secondary | ICD-10-CM

## 2015-01-03 ENCOUNTER — Ambulatory Visit (INDEPENDENT_AMBULATORY_CARE_PROVIDER_SITE_OTHER): Payer: BC Managed Care – PPO | Admitting: Psychology

## 2015-01-03 DIAGNOSIS — F332 Major depressive disorder, recurrent severe without psychotic features: Secondary | ICD-10-CM | POA: Diagnosis not present

## 2015-01-24 ENCOUNTER — Ambulatory Visit (INDEPENDENT_AMBULATORY_CARE_PROVIDER_SITE_OTHER): Payer: BC Managed Care – PPO | Admitting: Psychology

## 2015-01-24 DIAGNOSIS — F332 Major depressive disorder, recurrent severe without psychotic features: Secondary | ICD-10-CM

## 2015-02-14 ENCOUNTER — Ambulatory Visit (INDEPENDENT_AMBULATORY_CARE_PROVIDER_SITE_OTHER): Payer: BC Managed Care – PPO | Admitting: Psychology

## 2015-02-14 DIAGNOSIS — F332 Major depressive disorder, recurrent severe without psychotic features: Secondary | ICD-10-CM

## 2015-02-24 ENCOUNTER — Ambulatory Visit (INDEPENDENT_AMBULATORY_CARE_PROVIDER_SITE_OTHER): Payer: BC Managed Care – PPO | Admitting: Psychology

## 2015-02-24 DIAGNOSIS — F332 Major depressive disorder, recurrent severe without psychotic features: Secondary | ICD-10-CM | POA: Diagnosis not present

## 2015-03-14 ENCOUNTER — Ambulatory Visit (INDEPENDENT_AMBULATORY_CARE_PROVIDER_SITE_OTHER): Payer: BC Managed Care – PPO | Admitting: Psychology

## 2015-03-14 DIAGNOSIS — F332 Major depressive disorder, recurrent severe without psychotic features: Secondary | ICD-10-CM

## 2015-03-28 ENCOUNTER — Ambulatory Visit (INDEPENDENT_AMBULATORY_CARE_PROVIDER_SITE_OTHER): Payer: BC Managed Care – PPO | Admitting: Psychology

## 2015-03-28 DIAGNOSIS — F332 Major depressive disorder, recurrent severe without psychotic features: Secondary | ICD-10-CM

## 2015-04-11 ENCOUNTER — Ambulatory Visit (INDEPENDENT_AMBULATORY_CARE_PROVIDER_SITE_OTHER): Payer: BC Managed Care – PPO | Admitting: Psychology

## 2015-04-11 DIAGNOSIS — F332 Major depressive disorder, recurrent severe without psychotic features: Secondary | ICD-10-CM

## 2015-04-25 ENCOUNTER — Ambulatory Visit (INDEPENDENT_AMBULATORY_CARE_PROVIDER_SITE_OTHER): Payer: BC Managed Care – PPO | Admitting: Psychology

## 2015-04-25 DIAGNOSIS — F332 Major depressive disorder, recurrent severe without psychotic features: Secondary | ICD-10-CM

## 2015-05-09 ENCOUNTER — Ambulatory Visit (INDEPENDENT_AMBULATORY_CARE_PROVIDER_SITE_OTHER): Payer: BC Managed Care – PPO | Admitting: Psychology

## 2015-05-09 DIAGNOSIS — F332 Major depressive disorder, recurrent severe without psychotic features: Secondary | ICD-10-CM

## 2015-05-23 ENCOUNTER — Ambulatory Visit (INDEPENDENT_AMBULATORY_CARE_PROVIDER_SITE_OTHER): Payer: BC Managed Care – PPO | Admitting: Psychology

## 2015-05-23 DIAGNOSIS — F332 Major depressive disorder, recurrent severe without psychotic features: Secondary | ICD-10-CM | POA: Diagnosis not present

## 2015-06-06 ENCOUNTER — Ambulatory Visit (INDEPENDENT_AMBULATORY_CARE_PROVIDER_SITE_OTHER): Payer: BC Managed Care – PPO | Admitting: Psychology

## 2015-06-06 DIAGNOSIS — F332 Major depressive disorder, recurrent severe without psychotic features: Secondary | ICD-10-CM

## 2015-06-20 ENCOUNTER — Ambulatory Visit (INDEPENDENT_AMBULATORY_CARE_PROVIDER_SITE_OTHER): Payer: BC Managed Care – PPO | Admitting: Psychology

## 2015-06-20 DIAGNOSIS — F332 Major depressive disorder, recurrent severe without psychotic features: Secondary | ICD-10-CM | POA: Diagnosis not present

## 2015-07-04 ENCOUNTER — Ambulatory Visit (INDEPENDENT_AMBULATORY_CARE_PROVIDER_SITE_OTHER): Payer: BC Managed Care – PPO | Admitting: Psychology

## 2015-07-04 DIAGNOSIS — F332 Major depressive disorder, recurrent severe without psychotic features: Secondary | ICD-10-CM | POA: Diagnosis not present

## 2015-08-01 ENCOUNTER — Ambulatory Visit: Payer: BC Managed Care – PPO | Admitting: Psychology

## 2015-08-15 ENCOUNTER — Ambulatory Visit (INDEPENDENT_AMBULATORY_CARE_PROVIDER_SITE_OTHER): Payer: BC Managed Care – PPO | Admitting: Psychology

## 2015-08-15 DIAGNOSIS — F332 Major depressive disorder, recurrent severe without psychotic features: Secondary | ICD-10-CM

## 2015-09-12 ENCOUNTER — Ambulatory Visit (INDEPENDENT_AMBULATORY_CARE_PROVIDER_SITE_OTHER): Payer: BC Managed Care – PPO | Admitting: Psychology

## 2015-09-12 DIAGNOSIS — F332 Major depressive disorder, recurrent severe without psychotic features: Secondary | ICD-10-CM | POA: Diagnosis not present

## 2015-09-26 ENCOUNTER — Ambulatory Visit (INDEPENDENT_AMBULATORY_CARE_PROVIDER_SITE_OTHER): Payer: BC Managed Care – PPO | Admitting: Psychology

## 2015-09-26 DIAGNOSIS — F332 Major depressive disorder, recurrent severe without psychotic features: Secondary | ICD-10-CM

## 2015-10-10 ENCOUNTER — Ambulatory Visit (INDEPENDENT_AMBULATORY_CARE_PROVIDER_SITE_OTHER): Payer: BC Managed Care – PPO | Admitting: Psychology

## 2015-10-10 DIAGNOSIS — F332 Major depressive disorder, recurrent severe without psychotic features: Secondary | ICD-10-CM

## 2015-11-07 ENCOUNTER — Ambulatory Visit (INDEPENDENT_AMBULATORY_CARE_PROVIDER_SITE_OTHER): Payer: BC Managed Care – PPO | Admitting: Psychology

## 2015-11-07 DIAGNOSIS — F332 Major depressive disorder, recurrent severe without psychotic features: Secondary | ICD-10-CM | POA: Diagnosis not present

## 2015-11-21 ENCOUNTER — Ambulatory Visit (INDEPENDENT_AMBULATORY_CARE_PROVIDER_SITE_OTHER): Payer: BC Managed Care – PPO | Admitting: Psychology

## 2015-11-21 DIAGNOSIS — F332 Major depressive disorder, recurrent severe without psychotic features: Secondary | ICD-10-CM | POA: Diagnosis not present

## 2015-12-05 ENCOUNTER — Ambulatory Visit (INDEPENDENT_AMBULATORY_CARE_PROVIDER_SITE_OTHER): Payer: BC Managed Care – PPO | Admitting: Psychology

## 2015-12-05 DIAGNOSIS — F332 Major depressive disorder, recurrent severe without psychotic features: Secondary | ICD-10-CM | POA: Diagnosis not present

## 2015-12-19 ENCOUNTER — Ambulatory Visit (INDEPENDENT_AMBULATORY_CARE_PROVIDER_SITE_OTHER): Payer: BC Managed Care – PPO | Admitting: Psychology

## 2015-12-19 DIAGNOSIS — F331 Major depressive disorder, recurrent, moderate: Secondary | ICD-10-CM | POA: Diagnosis not present

## 2016-01-02 ENCOUNTER — Ambulatory Visit (INDEPENDENT_AMBULATORY_CARE_PROVIDER_SITE_OTHER): Payer: BC Managed Care – PPO | Admitting: Psychology

## 2016-01-02 DIAGNOSIS — F332 Major depressive disorder, recurrent severe without psychotic features: Secondary | ICD-10-CM

## 2016-02-17 ENCOUNTER — Ambulatory Visit (INDEPENDENT_AMBULATORY_CARE_PROVIDER_SITE_OTHER): Payer: BC Managed Care – PPO | Admitting: Psychology

## 2016-02-17 DIAGNOSIS — F331 Major depressive disorder, recurrent, moderate: Secondary | ICD-10-CM | POA: Diagnosis not present

## 2016-02-27 ENCOUNTER — Ambulatory Visit (INDEPENDENT_AMBULATORY_CARE_PROVIDER_SITE_OTHER): Payer: BC Managed Care – PPO | Admitting: Psychology

## 2016-02-27 DIAGNOSIS — F331 Major depressive disorder, recurrent, moderate: Secondary | ICD-10-CM

## 2016-03-12 ENCOUNTER — Ambulatory Visit (INDEPENDENT_AMBULATORY_CARE_PROVIDER_SITE_OTHER): Payer: BC Managed Care – PPO | Admitting: Psychology

## 2016-03-12 DIAGNOSIS — F331 Major depressive disorder, recurrent, moderate: Secondary | ICD-10-CM | POA: Diagnosis not present

## 2016-03-26 ENCOUNTER — Ambulatory Visit (INDEPENDENT_AMBULATORY_CARE_PROVIDER_SITE_OTHER): Payer: BC Managed Care – PPO | Admitting: Psychology

## 2016-03-26 DIAGNOSIS — F332 Major depressive disorder, recurrent severe without psychotic features: Secondary | ICD-10-CM | POA: Diagnosis not present

## 2016-04-09 ENCOUNTER — Ambulatory Visit (INDEPENDENT_AMBULATORY_CARE_PROVIDER_SITE_OTHER): Payer: BC Managed Care – PPO | Admitting: Psychology

## 2016-04-09 DIAGNOSIS — F332 Major depressive disorder, recurrent severe without psychotic features: Secondary | ICD-10-CM

## 2016-04-23 ENCOUNTER — Ambulatory Visit (INDEPENDENT_AMBULATORY_CARE_PROVIDER_SITE_OTHER): Payer: BC Managed Care – PPO | Admitting: Psychology

## 2016-04-23 DIAGNOSIS — F332 Major depressive disorder, recurrent severe without psychotic features: Secondary | ICD-10-CM

## 2016-05-07 ENCOUNTER — Ambulatory Visit (INDEPENDENT_AMBULATORY_CARE_PROVIDER_SITE_OTHER): Payer: BC Managed Care – PPO | Admitting: Psychology

## 2016-05-07 DIAGNOSIS — F332 Major depressive disorder, recurrent severe without psychotic features: Secondary | ICD-10-CM

## 2016-05-21 ENCOUNTER — Ambulatory Visit (INDEPENDENT_AMBULATORY_CARE_PROVIDER_SITE_OTHER): Payer: BC Managed Care – PPO | Admitting: Psychology

## 2016-05-21 DIAGNOSIS — F332 Major depressive disorder, recurrent severe without psychotic features: Secondary | ICD-10-CM | POA: Diagnosis not present

## 2016-05-22 ENCOUNTER — Emergency Department (HOSPITAL_BASED_OUTPATIENT_CLINIC_OR_DEPARTMENT_OTHER): Payer: Worker's Compensation

## 2016-05-22 ENCOUNTER — Emergency Department (HOSPITAL_BASED_OUTPATIENT_CLINIC_OR_DEPARTMENT_OTHER)
Admission: EM | Admit: 2016-05-22 | Discharge: 2016-05-22 | Disposition: A | Discharge status: Home / Self Care | Payer: Worker's Compensation | Attending: Emergency Medicine | Admitting: Emergency Medicine

## 2016-05-22 ENCOUNTER — Encounter (HOSPITAL_BASED_OUTPATIENT_CLINIC_OR_DEPARTMENT_OTHER): Payer: Self-pay | Admitting: *Deleted

## 2016-05-22 DIAGNOSIS — Y999 Unspecified external cause status: Secondary | ICD-10-CM | POA: Insufficient documentation

## 2016-05-22 DIAGNOSIS — S4991XA Unspecified injury of right shoulder and upper arm, initial encounter: Secondary | ICD-10-CM | POA: Diagnosis present

## 2016-05-22 DIAGNOSIS — Y9389 Activity, other specified: Secondary | ICD-10-CM | POA: Diagnosis not present

## 2016-05-22 DIAGNOSIS — X58XXXA Exposure to other specified factors, initial encounter: Secondary | ICD-10-CM | POA: Diagnosis not present

## 2016-05-22 DIAGNOSIS — Y929 Unspecified place or not applicable: Secondary | ICD-10-CM | POA: Insufficient documentation

## 2016-05-22 DIAGNOSIS — J45909 Unspecified asthma, uncomplicated: Secondary | ICD-10-CM | POA: Insufficient documentation

## 2016-05-22 DIAGNOSIS — Z79899 Other long term (current) drug therapy: Secondary | ICD-10-CM | POA: Insufficient documentation
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
## 2016-05-22 NOTE — Discharge Instructions (Signed)
° °  SEEK MEDICAL CARE IF:  Your symptoms do not improve within 1 week.   Your symptoms get worse.  You have symptoms in both arms. SEEK IMMEDIATE MEDICAL CARE IF:   You develop severe neck pain.  You lose control of your urine or stool.  You develop new or increased weakness in your arms or legs.

## 2016-05-28 ENCOUNTER — Encounter (HOSPITAL_BASED_OUTPATIENT_CLINIC_OR_DEPARTMENT_OTHER): Payer: Self-pay | Admitting: Emergency Medicine

## 2016-05-28 ENCOUNTER — Emergency Department (HOSPITAL_BASED_OUTPATIENT_CLINIC_OR_DEPARTMENT_OTHER)
Admission: EM | Admit: 2016-05-28 | Discharge: 2016-05-28 | Disposition: A | Discharge status: Home / Self Care | Payer: Worker's Compensation | Attending: Emergency Medicine | Admitting: Emergency Medicine

## 2016-05-28 DIAGNOSIS — X58XXXA Exposure to other specified factors, initial encounter: Secondary | ICD-10-CM | POA: Diagnosis not present

## 2016-05-28 DIAGNOSIS — S199XXA Unspecified injury of neck, initial encounter: Secondary | ICD-10-CM | POA: Diagnosis present

## 2016-05-28 DIAGNOSIS — J45909 Unspecified asthma, uncomplicated: Secondary | ICD-10-CM | POA: Diagnosis not present

## 2016-05-28 DIAGNOSIS — Y939 Activity, unspecified: Secondary | ICD-10-CM | POA: Diagnosis not present

## 2016-05-28 DIAGNOSIS — M549 Dorsalgia, unspecified: Secondary | ICD-10-CM | POA: Diagnosis not present

## 2016-05-28 DIAGNOSIS — S161XXA Strain of muscle, fascia and tendon at neck level, initial encounter: Secondary | ICD-10-CM | POA: Diagnosis not present

## 2016-05-28 DIAGNOSIS — T148XXA Other injury of unspecified body region, initial encounter: Secondary | ICD-10-CM

## 2016-05-28 DIAGNOSIS — Y999 Unspecified external cause status: Secondary | ICD-10-CM | POA: Diagnosis not present

## 2016-05-28 DIAGNOSIS — Y929 Unspecified place or not applicable: Secondary | ICD-10-CM | POA: Insufficient documentation

## 2016-05-28 MED ORDER — METHOCARBAMOL 500 MG PO TABS: 500.0000 mg | ORAL_TABLET | Freq: Two times a day (BID) | ORAL | 0 refills | Status: AC | PRN

## 2016-05-28 MED ORDER — IBUPROFEN 600 MG PO TABS: 600.0000 mg | ORAL_TABLET | Freq: Four times a day (QID) | ORAL | 0 refills | Status: AC | PRN

## 2016-05-28 MED FILL — METHOCARBAMOL 500 MG TABLET: 500 | 5 days supply | Qty: 10 | Fill #0

## 2016-05-28 MED FILL — IBUPROFEN 600 MG TABLET: 600 | 7 days supply | Qty: 30 | Fill #0

## 2016-05-28 NOTE — ED Provider Notes (Signed)
[facility] Provider Note   CSN: [id number] Arrival date & time: 05/28/16  0907     History   Chief Complaint Chief Complaint  Patient presents with  . Neck Pain    HPI Daisy Miller is a 35 y.o. female.  The history is provided by the patient and medical records. No language interpreter was used.  Neck Pain   Pertinent negatives include no chest pain, no numbness and no weakness.   Daisy Miller is a 35 y.o. female  with a PMH of asthma, PCOS, migraines who presents to the Emergency Department complaining of right posterior shoulder pain and posterior neck pain. Patient states she was seen last week for right shoulder injury associated with numbness to the right face and right neck. She states that over the last week her symptoms were improving, however yesterday symptoms started worsening again. She states that her shoulder is feeling improved for the most part, however she is now feeling pain to the back of the neck. She again is feeling numbness/tingling to the jaw and neck similar as before. She has taken ibuprofen once yesterday with moderate relief of pain. She states that she iced the area, then numbness began. Once her skin warmed, the numbness resolved, approximately 5-10 minutes.    Past Medical History:  Diagnosis Date  . Allergy   . Asthma   . Migraines   . PCOS (polycystic ovarian syndrome)     Patient Active Problem List   Diagnosis Date Noted  . PCOS (polycystic ovarian syndrome) 03/24/2012    Past Surgical History:  Procedure Laterality Date  . WISDOM TOOTH EXTRACTION      OB History    No data available       Home Medications    Prior to Admission medications   Medication Sig Start Date End Date Taking? Authorizing Provider  EPIPEN 2-PAK 0.3 MG/0.3ML SOAJ injection INJECT 1 DOSE INTO THE MUSCLE AS DIRECTED BY MD    Gwenlyn FoundJessica C Copland, MD  escitalopram (LEXAPRO) 5 MG tablet Take 5 mg by mouth daily.    Historical Provider, MD    ibuprofen (ADVIL,MOTRIN) 600 MG tablet Take 1 tablet (600 mg total) by mouth every 6 (six) hours as needed. 05/28/16   Havoc Sanluis Pilcher Kinley Dozier, PA-C  Levonorgestrel (SKYLA) 13.5 MG IUD by Intrauterine route.    Historical Provider, MD  methocarbamol (ROBAXIN) 500 MG tablet Take 1 tablet (500 mg total) by mouth 2 (two) times daily as needed for muscle spasms. 05/28/16   Chase PicketJaime Pilcher Keoshia Steinmetz, PA-C  Probiotic Product (PROBIOTIC & ACIDOPHILUS EX ST PO) Take by mouth.    Historical Provider, MD    Family History Family History  Problem Relation Age of Onset  . Cancer Mother 3655    uterine  . Diabetes Father   . Heart disease Father   . Cancer Father   . Cancer Maternal Grandmother   . Cancer Paternal Grandmother   . Cancer Paternal Grandfather   . Heart disease Paternal Grandfather     Social History Social History  Substance Use Topics  . Smoking status: Never Smoker  . Smokeless tobacco: Never Used  . Alcohol use 0.6 oz/week    1 Cans of beer per week     Allergies   Flagyl [metronidazole]; Latex; Camila [norethindrone]; Diflucan [fluconazole]; Medroxyprogesterone; Proctosol [hydrocortisone]; and Sprintec 28 [norgestimate-eth estradiol]   Review of Systems Review of Systems  Constitutional: Negative for diaphoresis and fever.  HENT: Negative for congestion.   Eyes: Negative for  visual disturbance.  Respiratory: Negative for shortness of breath.   Cardiovascular: Negative for chest pain and palpitations.  Gastrointestinal: Negative for abdominal pain.  Genitourinary: Negative for dysuria.  Musculoskeletal: Positive for back pain and neck pain.  Skin: Negative for color change and wound.  Neurological: Negative for weakness and numbness.     Physical Exam Updated Vital Signs BP 124/90 (BP Location: Left Arm)   Pulse 75   Temp 97.9 F (36.6 C) (Oral)   Resp 18   Ht 5\' 2"  (1.575 m)   Wt 70.3 kg   SpO2 98%   BMI 28.35 kg/m   Physical Exam  Constitutional: She is  oriented to person, place, and time. She appears well-developed and well-nourished. No distress.  HENT:  Head: Normocephalic and atraumatic.  Cardiovascular: Normal rate, regular rhythm and normal heart sounds.   No murmur heard. Pulmonary/Chest: Effort normal and breath sounds normal. No respiratory distress. She has no wheezes. She has no rales. She exhibits no tenderness.  Abdominal: Soft. She exhibits no distension. There is no tenderness.  Musculoskeletal:       Back:  TTP as depicted in image with no overlying skin changes. No midline tenderness. Full ROM of the right shoulder without pain. Negative empty can test, negative spurling's. 5/5 muscle strength in bilateral upper extremities including grip strength and pincer grasp. 2+ radial pulse and sensation intact to axillary, radial, ulnar, median nerve distributions.  Neurological: She is alert and oriented to person, place, and time.  Skin: Skin is warm and dry.  Nursing note and vitals reviewed.    ED Treatments / Results  Labs (all labs ordered are listed, but only abnormal results are displayed) Labs Reviewed - No data to display  EKG  EKG Interpretation None       Radiology No results found.  Procedures Procedures (including critical care time)  Medications Ordered in ED Medications - No data to display   Initial Impression / Assessment and Plan / ED Course  I have reviewed the triage vital signs and the nursing notes.  Pertinent labs & imaging results that were available during my care of the patient were reviewed by me and considered in my medical decision making (see chart for details).  Clinical Course    Daisy Miller is a 35 y.o. female who presents to ED for right-sided neck and back pain that began yesterday. Seen for right shoulder pain / neck pain last week and shoulder pain appears to have improved. On exam, there is no midline tenderness of neck or back. 5/5 muscle strength and UE's NVI. Has only  tried one dose of ibuprofen. Symptomatic home care instructions discussed at length with patient. Will give rx for robaxin and ibuprofen. Follow up with ortho if symptoms persist. Return precautions discussed and all questions answered.    Final Clinical Impressions(s) / ED Diagnoses   Final diagnoses:  Muscle strain    New Prescriptions New Prescriptions   IBUPROFEN (ADVIL,MOTRIN) 600 MG TABLET    Take 1 tablet (600 mg total) by mouth every 6 (six) hours as needed.   METHOCARBAMOL (ROBAXIN) 500 MG TABLET    Take 1 tablet (500 mg total) by mouth 2 (two) times daily as needed for muscle spasms.     Chase PicketJaime Pilcher Lashaundra Lehrmann, PA-C 05/28/16 1036    Shaune Pollackameron Isaacs, MD 05/28/16 2020

## 2016-05-28 NOTE — ED Notes (Signed)
Patient reports right sided jaw and neck pain.  Reports she has also had diarrhea and abnormal menstrual bleeding which began on Friday but ended yesterday.  States she has also been nauseated without vomiting and has no abdominal pain.

## 2016-05-28 NOTE — Discharge Instructions (Signed)
Ibuprofen 2-3 times a day as needed for pain. Robaxin is your muscle relaxer to take at night for additional relief. Ice affected area as well. If symptoms are not improved in one week, please follow up with your primary care provider or orthopedic physician for further evaluation. Return to ER for new or worsening symptoms, any additional concerns.   COLD THERAPY DIRECTIONS:  Ice or gel packs can be used to reduce both pain and swelling. Ice is the most helpful within the first 24 to 48 hours after an injury or flareup from overusing a muscle or joint.  Ice is effective, has very few side effects, and is safe for most people to use.   If you expose your skin to cold temperatures for too long or without the proper protection, you can damage your skin or nerves. Watch for signs of skin damage due to cold.   HOME CARE INSTRUCTIONS  Follow these tips to use ice and cold packs safely.  Place a dry or damp towel between the ice and skin. A damp towel will cool the skin more quickly, so you may need to shorten the time that the ice is used.  For a more rapid response, add gentle compression to the ice.  Ice for no more than 10 to 20 minutes at a time. The bonier the area you are icing, the less time it will take to get the benefits of ice.  Check your skin after 5 minutes to make sure there are no signs of a poor response to cold or skin damage.  Rest 20 minutes or more in between uses.  Once your skin is numb, you can end your treatment. You can test numbness by very lightly touching your skin. The touch should be so light that you do not see the skin dimple from the pressure of your fingertip. When using ice, most people will feel these normal sensations in this order: cold, burning, aching, and numbness.

## 2016-05-28 NOTE — ED Triage Notes (Signed)
Patient states that she had an injury to her right arm last week - patient reports that it was getting better until last night when she started to have some of the same symtoms return after she has put ice to the area.

## 2016-06-04 ENCOUNTER — Ambulatory Visit (INDEPENDENT_AMBULATORY_CARE_PROVIDER_SITE_OTHER): Payer: BC Managed Care – PPO | Admitting: Psychology

## 2016-06-04 DIAGNOSIS — F332 Major depressive disorder, recurrent severe without psychotic features: Secondary | ICD-10-CM | POA: Diagnosis not present

## 2016-06-18 ENCOUNTER — Ambulatory Visit (INDEPENDENT_AMBULATORY_CARE_PROVIDER_SITE_OTHER): Payer: BC Managed Care – PPO | Admitting: Psychology

## 2016-06-18 DIAGNOSIS — F332 Major depressive disorder, recurrent severe without psychotic features: Secondary | ICD-10-CM | POA: Diagnosis not present

## 2016-07-02 ENCOUNTER — Ambulatory Visit (INDEPENDENT_AMBULATORY_CARE_PROVIDER_SITE_OTHER): Payer: BC Managed Care – PPO | Admitting: Psychology

## 2016-07-02 DIAGNOSIS — F332 Major depressive disorder, recurrent severe without psychotic features: Secondary | ICD-10-CM

## 2016-07-30 ENCOUNTER — Ambulatory Visit (INDEPENDENT_AMBULATORY_CARE_PROVIDER_SITE_OTHER): Payer: BC Managed Care – PPO | Admitting: Psychology

## 2016-07-30 DIAGNOSIS — F332 Major depressive disorder, recurrent severe without psychotic features: Secondary | ICD-10-CM | POA: Diagnosis not present

## 2016-08-13 ENCOUNTER — Ambulatory Visit (INDEPENDENT_AMBULATORY_CARE_PROVIDER_SITE_OTHER): Payer: BC Managed Care – PPO | Admitting: Psychology

## 2016-08-13 DIAGNOSIS — F332 Major depressive disorder, recurrent severe without psychotic features: Secondary | ICD-10-CM

## 2016-08-27 ENCOUNTER — Ambulatory Visit: Payer: BC Managed Care – PPO | Admitting: Psychology

## 2016-09-10 ENCOUNTER — Ambulatory Visit (INDEPENDENT_AMBULATORY_CARE_PROVIDER_SITE_OTHER): Payer: BC Managed Care – PPO | Admitting: Psychology

## 2016-09-10 DIAGNOSIS — F332 Major depressive disorder, recurrent severe without psychotic features: Secondary | ICD-10-CM

## 2016-09-24 ENCOUNTER — Ambulatory Visit: Payer: BC Managed Care – PPO | Admitting: Psychology

## 2016-10-03 ENCOUNTER — Ambulatory Visit (INDEPENDENT_AMBULATORY_CARE_PROVIDER_SITE_OTHER): Payer: BC Managed Care – PPO | Admitting: Psychology

## 2016-10-03 DIAGNOSIS — F332 Major depressive disorder, recurrent severe without psychotic features: Secondary | ICD-10-CM | POA: Diagnosis not present

## 2016-10-22 ENCOUNTER — Ambulatory Visit (INDEPENDENT_AMBULATORY_CARE_PROVIDER_SITE_OTHER): Payer: BC Managed Care – PPO | Admitting: Psychology

## 2016-10-22 DIAGNOSIS — F332 Major depressive disorder, recurrent severe without psychotic features: Secondary | ICD-10-CM | POA: Diagnosis not present

## 2016-11-05 ENCOUNTER — Ambulatory Visit (INDEPENDENT_AMBULATORY_CARE_PROVIDER_SITE_OTHER): Payer: BC Managed Care – PPO | Admitting: Psychology

## 2016-11-05 DIAGNOSIS — F332 Major depressive disorder, recurrent severe without psychotic features: Secondary | ICD-10-CM

## 2016-11-19 ENCOUNTER — Ambulatory Visit (INDEPENDENT_AMBULATORY_CARE_PROVIDER_SITE_OTHER): Payer: BC Managed Care – PPO | Admitting: Psychology

## 2016-11-19 DIAGNOSIS — F332 Major depressive disorder, recurrent severe without psychotic features: Secondary | ICD-10-CM

## 2016-12-03 ENCOUNTER — Ambulatory Visit (INDEPENDENT_AMBULATORY_CARE_PROVIDER_SITE_OTHER): Payer: BC Managed Care – PPO | Admitting: Psychology

## 2016-12-03 DIAGNOSIS — F332 Major depressive disorder, recurrent severe without psychotic features: Secondary | ICD-10-CM | POA: Diagnosis not present

## 2016-12-17 ENCOUNTER — Ambulatory Visit (INDEPENDENT_AMBULATORY_CARE_PROVIDER_SITE_OTHER): Payer: BC Managed Care – PPO | Admitting: Psychology

## 2016-12-17 DIAGNOSIS — F332 Major depressive disorder, recurrent severe without psychotic features: Secondary | ICD-10-CM

## 2017-01-14 ENCOUNTER — Ambulatory Visit (INDEPENDENT_AMBULATORY_CARE_PROVIDER_SITE_OTHER): Payer: BC Managed Care – PPO | Admitting: Psychology

## 2017-01-14 DIAGNOSIS — F332 Major depressive disorder, recurrent severe without psychotic features: Secondary | ICD-10-CM | POA: Diagnosis not present

## 2017-02-11 ENCOUNTER — Ambulatory Visit (INDEPENDENT_AMBULATORY_CARE_PROVIDER_SITE_OTHER): Payer: BC Managed Care – PPO | Admitting: Psychology

## 2017-02-11 DIAGNOSIS — F332 Major depressive disorder, recurrent severe without psychotic features: Secondary | ICD-10-CM | POA: Diagnosis not present

## 2017-02-25 ENCOUNTER — Ambulatory Visit (INDEPENDENT_AMBULATORY_CARE_PROVIDER_SITE_OTHER): Payer: BC Managed Care – PPO | Admitting: Psychology

## 2017-02-25 DIAGNOSIS — F332 Major depressive disorder, recurrent severe without psychotic features: Secondary | ICD-10-CM

## 2017-03-11 ENCOUNTER — Ambulatory Visit (INDEPENDENT_AMBULATORY_CARE_PROVIDER_SITE_OTHER): Payer: BC Managed Care – PPO | Admitting: Psychology

## 2017-03-11 DIAGNOSIS — F332 Major depressive disorder, recurrent severe without psychotic features: Secondary | ICD-10-CM

## 2017-04-08 ENCOUNTER — Ambulatory Visit (INDEPENDENT_AMBULATORY_CARE_PROVIDER_SITE_OTHER): Payer: BC Managed Care – PPO | Admitting: Psychology

## 2017-04-08 DIAGNOSIS — F332 Major depressive disorder, recurrent severe without psychotic features: Secondary | ICD-10-CM | POA: Diagnosis not present

## 2017-04-22 ENCOUNTER — Ambulatory Visit (INDEPENDENT_AMBULATORY_CARE_PROVIDER_SITE_OTHER): Payer: BC Managed Care – PPO | Admitting: Psychology

## 2017-04-22 DIAGNOSIS — F332 Major depressive disorder, recurrent severe without psychotic features: Secondary | ICD-10-CM | POA: Diagnosis not present

## 2017-05-06 ENCOUNTER — Ambulatory Visit (INDEPENDENT_AMBULATORY_CARE_PROVIDER_SITE_OTHER): Payer: BC Managed Care – PPO | Admitting: Psychology

## 2017-05-06 DIAGNOSIS — F332 Major depressive disorder, recurrent severe without psychotic features: Secondary | ICD-10-CM

## 2017-05-20 ENCOUNTER — Ambulatory Visit (INDEPENDENT_AMBULATORY_CARE_PROVIDER_SITE_OTHER): Payer: BC Managed Care – PPO | Admitting: Psychology

## 2017-05-20 DIAGNOSIS — F332 Major depressive disorder, recurrent severe without psychotic features: Secondary | ICD-10-CM

## 2017-06-03 ENCOUNTER — Ambulatory Visit (INDEPENDENT_AMBULATORY_CARE_PROVIDER_SITE_OTHER): Payer: BC Managed Care – PPO | Admitting: Psychology

## 2017-06-03 DIAGNOSIS — F332 Major depressive disorder, recurrent severe without psychotic features: Secondary | ICD-10-CM

## 2017-06-17 ENCOUNTER — Emergency Department (HOSPITAL_COMMUNITY)
Admission: EM | Admit: 2017-06-17 | Discharge: 2017-06-17 | Disposition: A | Discharge status: Home / Self Care | Payer: BC Managed Care – PPO | Source: Home / Self Care

## 2017-06-17 ENCOUNTER — Other Ambulatory Visit: Payer: Self-pay

## 2017-06-17 ENCOUNTER — Emergency Department (HOSPITAL_BASED_OUTPATIENT_CLINIC_OR_DEPARTMENT_OTHER)
Admission: EM | Admit: 2017-06-17 | Discharge: 2017-06-17 | Disposition: A | Discharge status: Home / Self Care | Payer: BC Managed Care – PPO | Attending: Emergency Medicine | Admitting: Emergency Medicine

## 2017-06-17 ENCOUNTER — Ambulatory Visit: Payer: Self-pay | Admitting: Psychology

## 2017-06-17 ENCOUNTER — Emergency Department (HOSPITAL_BASED_OUTPATIENT_CLINIC_OR_DEPARTMENT_OTHER): Payer: BC Managed Care – PPO

## 2017-06-17 ENCOUNTER — Encounter (HOSPITAL_COMMUNITY): Payer: Self-pay | Admitting: Emergency Medicine

## 2017-06-17 ENCOUNTER — Encounter (HOSPITAL_BASED_OUTPATIENT_CLINIC_OR_DEPARTMENT_OTHER): Payer: Self-pay | Admitting: Emergency Medicine

## 2017-06-17 DIAGNOSIS — R350 Frequency of micturition: Secondary | ICD-10-CM | POA: Insufficient documentation

## 2017-06-17 DIAGNOSIS — R1031 Right lower quadrant pain: Secondary | ICD-10-CM | POA: Diagnosis present

## 2017-06-17 DIAGNOSIS — R109 Unspecified abdominal pain: Secondary | ICD-10-CM | POA: Insufficient documentation

## 2017-06-17 DIAGNOSIS — J45909 Unspecified asthma, uncomplicated: Secondary | ICD-10-CM | POA: Diagnosis not present

## 2017-06-17 DIAGNOSIS — Z79899 Other long term (current) drug therapy: Secondary | ICD-10-CM | POA: Insufficient documentation

## 2017-06-17 DIAGNOSIS — N200 Calculus of kidney: Secondary | ICD-10-CM | POA: Diagnosis not present

## 2017-06-17 DIAGNOSIS — R112 Nausea with vomiting, unspecified: Secondary | ICD-10-CM | POA: Insufficient documentation

## 2017-06-17 DIAGNOSIS — Z5321 Procedure and treatment not carried out due to patient leaving prior to being seen by health care provider: Secondary | ICD-10-CM

## 2017-06-17 DIAGNOSIS — N201 Calculus of ureter: Secondary | ICD-10-CM | POA: Diagnosis not present

## 2017-06-17 DIAGNOSIS — Z91040 Latex allergy status: Secondary | ICD-10-CM | POA: Insufficient documentation

## 2017-06-17 LAB — URINALYSIS, ROUTINE W REFLEX MICROSCOPIC
Bilirubin Urine: NEGATIVE
Glucose, UA: NEGATIVE mg/dL
Ketones, ur: 20 mg/dL — AB
Leukocytes, UA: NEGATIVE
Nitrite: NEGATIVE
PH: 9 — AB (ref 5.0–8.0)
Protein, ur: NEGATIVE mg/dL
Specific Gravity, Urine: 1.017 (ref 1.005–1.030)

## 2017-06-17 LAB — COMPREHENSIVE METABOLIC PANEL
ALT: 32 U/L (ref 14–54)
ANION GAP: 14 (ref 5–15)
AST: 47 U/L — AB (ref 15–41)
Albumin: 4.7 g/dL (ref 3.5–5.0)
Alkaline Phosphatase: 71 U/L (ref 38–126)
BILIRUBIN TOTAL: 0.9 mg/dL (ref 0.3–1.2)
BUN: 18 mg/dL (ref 6–20)
CO2: 21 mmol/L — ABNORMAL LOW (ref 22–32)
Calcium: 9.7 mg/dL (ref 8.9–10.3)
Chloride: 103 mmol/L (ref 101–111)
Creatinine, Ser: 1 mg/dL (ref 0.44–1.00)
Glucose, Bld: 109 mg/dL — ABNORMAL HIGH (ref 65–99)
POTASSIUM: 3.6 mmol/L (ref 3.5–5.1)
Sodium: 138 mmol/L (ref 135–145)
TOTAL PROTEIN: 8.4 g/dL — AB (ref 6.5–8.1)

## 2017-06-17 LAB — LIPASE, BLOOD: Lipase: 32 U/L (ref 11–51)

## 2017-06-17 LAB — CBC
HEMATOCRIT: 43.2 % (ref 36.0–46.0)
Hemoglobin: 14.8 g/dL (ref 12.0–15.0)
MCH: 30.1 pg (ref 26.0–34.0)
MCHC: 34.3 g/dL (ref 30.0–36.0)
MCV: 88 fL (ref 78.0–100.0)
Platelets: 259 10*3/uL (ref 150–400)
RBC: 4.91 MIL/uL (ref 3.87–5.11)
RDW: 12.8 % (ref 11.5–15.5)
WBC: 12.5 10*3/uL — AB (ref 4.0–10.5)

## 2017-06-17 LAB — POC URINE PREG, ED: PREG TEST UR: NEGATIVE

## 2017-06-17 MED ORDER — ONDANSETRON 4 MG PO TBDP: 4.0000 mg | ORAL_TABLET | Freq: Four times a day (QID) | ORAL | 0 refills | Status: AC | PRN

## 2017-06-17 MED ORDER — KETOROLAC TROMETHAMINE 30 MG/ML IJ SOLN
30.0000 mg | Freq: Once | INTRAMUSCULAR | Status: AC
  Administered 2017-06-17: 30 mg via INTRAVENOUS
  Filled 2017-06-17: qty 1

## 2017-06-17 MED ORDER — IBUPROFEN 600 MG PO TABS: 600.0000 mg | ORAL_TABLET | Freq: Four times a day (QID) | ORAL | 0 refills | Status: AC | PRN

## 2017-06-17 MED ORDER — TAMSULOSIN HCL 0.4 MG PO CAPS: 0.4000 mg | ORAL_CAPSULE | Freq: Every day | ORAL | 0 refills | Status: AC

## 2017-06-17 MED ORDER — ONDANSETRON HCL 4 MG/2ML IJ SOLN
4.0000 mg | Freq: Once | INTRAMUSCULAR | Status: AC
  Administered 2017-06-17: 4 mg via INTRAVENOUS
  Filled 2017-06-17: qty 2

## 2017-06-17 MED ORDER — SODIUM CHLORIDE 0.9 % IV BOLUS (SEPSIS)
1000.0000 mL | Freq: Once | INTRAVENOUS | Status: AC
  Administered 2017-06-17: 1000 mL via INTRAVENOUS

## 2017-06-17 MED ORDER — MORPHINE SULFATE (PF) 4 MG/ML IV SOLN
4.0000 mg | Freq: Once | INTRAVENOUS | Status: AC
  Administered 2017-06-17: 4 mg via INTRAVENOUS
  Filled 2017-06-17: qty 1

## 2017-06-17 MED ORDER — OXYCODONE-ACETAMINOPHEN 5-325 MG PO TABS: 1.0000 | ORAL_TABLET | Freq: Four times a day (QID) | ORAL | 0 refills | Status: AC | PRN

## 2017-06-17 MED FILL — TAMSULOSIN HCL 0.4 MG CAP: 0.4 | 20 days supply | Qty: 20 | Fill #0

## 2017-06-17 MED FILL — ONDANSETRON ODT 4 MG TABLET: 4 | 21 days supply | Qty: 18 | Fill #0 | Status: TO

## 2017-06-17 MED FILL — IBUPROFEN 600 MG TABLET: 600 | 8 days supply | Qty: 30 | Fill #0

## 2017-06-17 MED FILL — OXYCODONE-ACETAMINOPHEN 5-3: 5-325 | 3 days supply | Qty: 20 | Fill #0

## 2017-06-17 NOTE — ED Notes (Signed)
Patient states that she feels much better. Denies any Nausea at this. Resting comfortably with family

## 2017-06-17 NOTE — ED Notes (Signed)
Patient it currently here at [facility]. Patient is d/c'/d to be [facility]

## 2017-06-17 NOTE — ED Triage Notes (Signed)
Pt c/o R abdominal pain and R flank pain with urinary frequency since last night.

## 2017-06-17 NOTE — ED Notes (Signed)
Pt with friend, friend c/o to nurse and escalating about wait time and states she will take patient to another hospital. Advised patient that there will possibly be same wait at another hospital and we will get labs and u/a. Pt states she will wait to be seen.

## 2017-06-17 NOTE — Discharge Instructions (Signed)
You have a 3 mm kidney stone close to the bladder on the right side.  Please follow-up with your PCP or urologist regarding this. Take medications as prescribed. Drink plenty of fluids.  Return without fail for worsening symptoms, including fever, intractable vomiting, escalating pain or any other symptoms concerning to you.

## 2017-06-17 NOTE — ED Triage Notes (Signed)
Patentis here, states that [facility] for over an [facility]. The patient presents here in Car and got out of car and laid down on the concrete saying that it felt better. Patient reports that she has pain to her right flank with n/V  - patient states that she started to have this pain last night. The patient that she feels better laying down. Patient is very anxious and is hyperventilating during triage

## 2017-06-17 NOTE — ED Provider Notes (Signed)
[facility] EMERGENCY DEPARTMENT Provider Note   CSN: [id number] Arrival date & time: 06/17/17  1259     History   Chief Complaint Chief Complaint  Patient presents with  . Flank Pain    HPI Daisy Miller is a 36 y.o. female.  HPI  36 year old female who presents with right flank pain.  She has a history of migraine headaches and PCO S.  Reports onset of right flank pain yesterday afternoon associated with urinary frequency.  Symptoms gradually got worse throughout the day and associated with nausea and vomiting.  No fever, chills, diarrhea, constipation, abnormal vaginal bleeding or discharge, hematuria or dysuria.  Past Medical History:  Diagnosis Date  . Allergy   . Asthma   . Migraines   . PCOS (polycystic ovarian syndrome)     Patient Active Problem List   Diagnosis Date Noted  . PCOS (polycystic ovarian syndrome) 03/24/2012    Past Surgical History:  Procedure Laterality Date  . WISDOM TOOTH EXTRACTION      OB History    No data available       Home Medications    Prior to Admission medications   Medication Sig Start Date End Date Taking? Authorizing Provider  EPIPEN 2-PAK 0.3 MG/0.3ML SOAJ injection INJECT 1 DOSE INTO THE MUSCLE AS DIRECTED BY MD    Copland, Gwenlyn Found, MD  escitalopram (LEXAPRO) 5 MG tablet Take 5 mg by mouth daily.    [provider]  ibuprofen (ADVIL,MOTRIN) 600 MG tablet Take 1 tablet (600 mg total) by mouth every 6 (six) hours as needed. 05/28/16   Ward, Chase Picket, PA-C  ibuprofen (ADVIL,MOTRIN) 600 MG tablet Take 1 tablet (600 mg total) by mouth every 6 (six) hours as needed. 06/17/17   Lavera Guise, MD  Levonorgestrel (SKYLA) 13.5 MG IUD by Intrauterine route.    [provider]  methocarbamol (ROBAXIN) 500 MG tablet Take 1 tablet (500 mg total) by mouth 2 (two) times daily as needed for muscle spasms. 05/28/16   Ward, Chase Picket, PA-C  ondansetron (ZOFRAN ODT) 4 MG disintegrating tablet Take 1  tablet (4 mg total) by mouth every 6 (six) hours as needed for nausea or vomiting. 06/17/17   Lavera Guise, MD  oxyCODONE-acetaminophen (PERCOCET/ROXICET) 5-325 MG tablet Take 1-2 tablets by mouth every 6 (six) hours as needed for severe pain. 06/17/17   Lavera Guise, MD  Probiotic Product (PROBIOTIC & ACIDOPHILUS EX ST PO) Take by mouth.    [provider]  tamsulosin (FLOMAX) 0.4 MG CAPS capsule Take 1 capsule (0.4 mg total) by mouth daily. 06/17/17   Lavera Guise, MD    Family History Family History  Problem Relation Age of Onset  . Cancer Mother 67       uterine  . Diabetes Father   . Heart disease Father   . Cancer Father   . Cancer Maternal Grandmother   . Cancer Paternal Grandmother   . Cancer Paternal Grandfather   . Heart disease Paternal Grandfather     Social History Social History   Tobacco Use  . Smoking status: Never Smoker  . Smokeless tobacco: Never Used  Substance Use Topics  . Alcohol use: Yes    Alcohol/week: 0.6 oz    Types: 1 Cans of beer per week  . Drug use: No     Allergies   Flagyl [metronidazole]; Latex; Camila [norethindrone]; Diflucan [fluconazole]; Medroxyprogesterone; Proctosol [hydrocortisone]; and Sprintec 28 [norgestimate-eth estradiol]   Review of Systems Review  of Systems  Constitutional: Negative for fever.  Gastrointestinal: Positive for abdominal pain, nausea and vomiting.  Genitourinary: Positive for flank pain. Negative for dysuria and hematuria.  All other systems reviewed and are negative.    Physical Exam Updated Vital Signs BP 124/88 (BP Location: Right Arm)   Pulse 80   Temp 98.1 F (36.7 C) (Oral)   Resp (!) 22   Ht 5' 2.5" (1.588 m)   Wt 74.8 kg (165 lb)   SpO2 100%   BMI 29.70 kg/m   Physical Exam Physical Exam  Nursing note and vitals reviewed. Constitutional: Well developed, well nourished, non-toxic, and in no acute distress Head: Normocephalic and atraumatic.  Mouth/Throat: Oropharynx is  clear and moist.  Neck: Normal range of motion. Neck supple.  Cardiovascular: Normal rate and regular rhythm.   Pulmonary/Chest: Effort normal and breath sounds normal.  Abdominal: Soft. There is right sided abdominal and flank tenderness. There is no rebound and no guarding. Mild right CVA tenderness Musculoskeletal: Normal range of motion.  Neurological: Alert, no facial droop, fluent speech, moves all extremities symmetrically Skin: Skin is warm and dry.  Psychiatric: Cooperative   ED Treatments / Results  Labs (all labs ordered are listed, but only abnormal results are displayed) Labs Reviewed - No data to display  EKG  EKG Interpretation None       Radiology Ct Renal Stone Study  Result Date: 06/17/2017 CLINICAL DATA:  36 year old female presenting with right abdominal and flank pain with urinary frequency since last evening. Microscopic hematuria. EXAM: CT ABDOMEN AND PELVIS WITHOUT CONTRAST TECHNIQUE: Multidetector CT imaging of the abdomen and pelvis was performed following the standard protocol without IV contrast. COMPARISON:  Abdomen radiograph from 07/06/2011 FINDINGS: Lower chest: No acute abnormality. Small hiatal hernia. Clear lung bases. Hepatobiliary: No focal liver abnormality is seen. No gallstones, gallbladder wall thickening, or biliary dilatation. Pancreas: Unremarkable. No pancreatic ductal dilatation or surrounding inflammatory changes. Spleen: Normal in size without focal abnormality. Adrenals/Urinary Tract: Normal bilateral adrenal glands. Mild moderate right-sided hydroureteronephrosis secondary to a 3 mm calculus in the distal right ureter approximately 1 cm from the UVJ. Nonobstructing left-sided renal calculi are identified the largest are in a cluster of 3 in the lower pole each approximately 4 mm in diameter. Stomach/Bowel: Stomach is within normal limits. Appendix appears normal. No evidence of bowel wall thickening, distention, or inflammatory changes.  Vascular/Lymphatic: No significant vascular findings are present. No enlarged abdominal or pelvic lymph nodes. Reproductive: Uterus and bilateral adnexa are unremarkable. IUD within the expected location of the endometrium. Other: No abdominal wall hernia or abnormality. No abdominopelvic ascites. Musculoskeletal: No acute or significant osseous findings. IMPRESSION: 1. Distal right ureteral calculus measuring 3 mm with associated mild-to-moderate right-sided hydroureteronephrosis. The calculus is approximately 1 cm from the ureterovesical junction. 2. Left-sided nephrolithiasis without obstruction. Electronically Signed   By: Tollie Ethavid  Kwon M.D.   On: 06/17/2017 13:42    Procedures Procedures (including critical care time)  Medications Ordered in ED Medications  ondansetron (ZOFRAN) injection 4 mg (4 mg Intravenous Given 06/17/17 1319)  sodium chloride 0.9 % bolus 1,000 mL (1,000 mLs Intravenous New Bag/Given 06/17/17 1343)  morphine 4 MG/ML injection 4 mg (4 mg Intravenous Given 06/17/17 1319)  ketorolac (TORADOL) 30 MG/ML injection 30 mg (30 mg Intravenous Given 06/17/17 1352)     Initial Impression / Assessment and Plan / ED Course  I have reviewed the triage vital signs and the nursing notes.  Pertinent labs & imaging results that were  available during my care of the patient were reviewed by me and considered in my medical decision making (see chart for details).     Presents with sudden onset of severe right-sided flank pain with nausea and vomiting since yesterday evening.  [facility], but due to the long wait came to med center ED for evaluation.  [facility].  This is reviewed.  Blood work overall unremarkable, no leukocytosis and normal renal function.  No major electrolyte or metabolic derangements.  UA with small amount of blood but no signs of infection.  Clinically, she appears to likely have potential kidney stone disease.   A CT renal study was performed, visualized and shows a right 3 mm ureteral stone with hydronephrosis.  Pain was well controlled with morphine and Toradol in the ED.  She has no further vomiting and tolerating p.o.  Felt to be appropriate for outpatient management.  Urology follow-up provided as needed.  Will discharge with pain control, antiemetics. Strict return and follow-up instructions reviewed. She expressed understanding of all discharge instructions and felt comfortable with the plan of care.   Final Clinical Impressions(s) / ED Diagnoses   Final diagnoses:  Right ureteral stone  Kidney stone    ED Discharge Orders        Ordered    oxyCODONE-acetaminophen (PERCOCET/ROXICET) 5-325 MG tablet  Every 6 hours PRN     06/17/17 1437    ondansetron (ZOFRAN ODT) 4 MG disintegrating tablet  Every 6 hours PRN     06/17/17 1437    ibuprofen (ADVIL,MOTRIN) 600 MG tablet  Every 6 hours PRN     06/17/17 1437    tamsulosin (FLOMAX) 0.4 MG CAPS capsule  Daily     06/17/17 1437       Lavera GuiseLiu, Joven Mom Duo, MD 06/17/17 1440

## 2017-07-01 ENCOUNTER — Ambulatory Visit (INDEPENDENT_AMBULATORY_CARE_PROVIDER_SITE_OTHER): Payer: BC Managed Care – PPO | Admitting: Psychology

## 2017-07-01 DIAGNOSIS — F332 Major depressive disorder, recurrent severe without psychotic features: Secondary | ICD-10-CM | POA: Diagnosis not present

## 2017-07-15 ENCOUNTER — Ambulatory Visit: Payer: Self-pay | Admitting: Psychology

## 2017-07-29 ENCOUNTER — Ambulatory Visit (INDEPENDENT_AMBULATORY_CARE_PROVIDER_SITE_OTHER): Payer: BC Managed Care – PPO | Admitting: Psychology

## 2017-07-29 DIAGNOSIS — F332 Major depressive disorder, recurrent severe without psychotic features: Secondary | ICD-10-CM | POA: Diagnosis not present

## 2017-08-12 ENCOUNTER — Ambulatory Visit (INDEPENDENT_AMBULATORY_CARE_PROVIDER_SITE_OTHER): Payer: BC Managed Care – PPO | Admitting: Psychology

## 2017-08-12 DIAGNOSIS — F332 Major depressive disorder, recurrent severe without psychotic features: Secondary | ICD-10-CM | POA: Diagnosis not present

## 2017-08-26 ENCOUNTER — Ambulatory Visit (INDEPENDENT_AMBULATORY_CARE_PROVIDER_SITE_OTHER): Payer: BC Managed Care – PPO | Admitting: Psychology

## 2017-08-26 DIAGNOSIS — F332 Major depressive disorder, recurrent severe without psychotic features: Secondary | ICD-10-CM | POA: Diagnosis not present

## 2017-09-09 ENCOUNTER — Ambulatory Visit (INDEPENDENT_AMBULATORY_CARE_PROVIDER_SITE_OTHER): Payer: BC Managed Care – PPO | Admitting: Psychology

## 2017-09-09 DIAGNOSIS — F332 Major depressive disorder, recurrent severe without psychotic features: Secondary | ICD-10-CM | POA: Diagnosis not present

## 2017-09-23 ENCOUNTER — Ambulatory Visit (INDEPENDENT_AMBULATORY_CARE_PROVIDER_SITE_OTHER): Payer: BC Managed Care – PPO | Admitting: Psychology

## 2017-09-23 DIAGNOSIS — F332 Major depressive disorder, recurrent severe without psychotic features: Secondary | ICD-10-CM | POA: Diagnosis not present

## 2017-10-07 ENCOUNTER — Ambulatory Visit (INDEPENDENT_AMBULATORY_CARE_PROVIDER_SITE_OTHER): Payer: BC Managed Care – PPO | Admitting: Psychology

## 2017-10-07 DIAGNOSIS — F332 Major depressive disorder, recurrent severe without psychotic features: Secondary | ICD-10-CM

## 2017-10-21 ENCOUNTER — Ambulatory Visit (INDEPENDENT_AMBULATORY_CARE_PROVIDER_SITE_OTHER): Payer: BC Managed Care – PPO | Admitting: Psychology

## 2017-10-21 DIAGNOSIS — F332 Major depressive disorder, recurrent severe without psychotic features: Secondary | ICD-10-CM | POA: Diagnosis not present

## 2017-11-04 ENCOUNTER — Ambulatory Visit: Payer: BC Managed Care – PPO | Admitting: Psychology

## 2017-11-18 ENCOUNTER — Ambulatory Visit (INDEPENDENT_AMBULATORY_CARE_PROVIDER_SITE_OTHER): Payer: BC Managed Care – PPO | Admitting: Psychology

## 2017-11-18 DIAGNOSIS — F332 Major depressive disorder, recurrent severe without psychotic features: Secondary | ICD-10-CM

## 2017-12-02 ENCOUNTER — Ambulatory Visit (INDEPENDENT_AMBULATORY_CARE_PROVIDER_SITE_OTHER): Payer: BC Managed Care – PPO | Admitting: Psychology

## 2017-12-02 DIAGNOSIS — F332 Major depressive disorder, recurrent severe without psychotic features: Secondary | ICD-10-CM | POA: Diagnosis not present

## 2017-12-16 ENCOUNTER — Ambulatory Visit (INDEPENDENT_AMBULATORY_CARE_PROVIDER_SITE_OTHER): Payer: BC Managed Care – PPO | Admitting: Psychology

## 2017-12-16 DIAGNOSIS — F332 Major depressive disorder, recurrent severe without psychotic features: Secondary | ICD-10-CM

## 2017-12-30 ENCOUNTER — Ambulatory Visit (INDEPENDENT_AMBULATORY_CARE_PROVIDER_SITE_OTHER): Payer: BC Managed Care – PPO | Admitting: Psychology

## 2017-12-30 DIAGNOSIS — F332 Major depressive disorder, recurrent severe without psychotic features: Secondary | ICD-10-CM | POA: Diagnosis not present

## 2018-01-13 ENCOUNTER — Ambulatory Visit (INDEPENDENT_AMBULATORY_CARE_PROVIDER_SITE_OTHER): Payer: BC Managed Care – PPO | Admitting: Psychology

## 2018-01-13 DIAGNOSIS — F332 Major depressive disorder, recurrent severe without psychotic features: Secondary | ICD-10-CM

## 2018-01-27 ENCOUNTER — Ambulatory Visit: Payer: BC Managed Care – PPO | Admitting: Psychology

## 2018-02-24 ENCOUNTER — Ambulatory Visit (INDEPENDENT_AMBULATORY_CARE_PROVIDER_SITE_OTHER): Payer: BC Managed Care – PPO | Admitting: Psychology

## 2018-02-24 DIAGNOSIS — F332 Major depressive disorder, recurrent severe without psychotic features: Secondary | ICD-10-CM

## 2018-03-10 ENCOUNTER — Ambulatory Visit (INDEPENDENT_AMBULATORY_CARE_PROVIDER_SITE_OTHER): Payer: BC Managed Care – PPO | Admitting: Psychology

## 2018-03-10 DIAGNOSIS — F332 Major depressive disorder, recurrent severe without psychotic features: Secondary | ICD-10-CM | POA: Diagnosis not present

## 2018-03-24 ENCOUNTER — Ambulatory Visit (INDEPENDENT_AMBULATORY_CARE_PROVIDER_SITE_OTHER): Payer: BC Managed Care – PPO | Admitting: Psychology

## 2018-03-24 DIAGNOSIS — F332 Major depressive disorder, recurrent severe without psychotic features: Secondary | ICD-10-CM | POA: Diagnosis not present

## 2018-04-07 ENCOUNTER — Ambulatory Visit: Payer: BC Managed Care – PPO | Admitting: Psychology

## 2018-04-21 ENCOUNTER — Ambulatory Visit (INDEPENDENT_AMBULATORY_CARE_PROVIDER_SITE_OTHER): Payer: BC Managed Care – PPO | Admitting: Psychology

## 2018-04-21 DIAGNOSIS — F332 Major depressive disorder, recurrent severe without psychotic features: Secondary | ICD-10-CM | POA: Diagnosis not present

## 2018-05-05 ENCOUNTER — Ambulatory Visit (INDEPENDENT_AMBULATORY_CARE_PROVIDER_SITE_OTHER): Payer: BC Managed Care – PPO | Admitting: Psychology

## 2018-05-05 DIAGNOSIS — F332 Major depressive disorder, recurrent severe without psychotic features: Secondary | ICD-10-CM

## 2018-05-19 ENCOUNTER — Ambulatory Visit (INDEPENDENT_AMBULATORY_CARE_PROVIDER_SITE_OTHER): Payer: BC Managed Care – PPO | Admitting: Psychology

## 2018-05-19 DIAGNOSIS — F332 Major depressive disorder, recurrent severe without psychotic features: Secondary | ICD-10-CM

## 2018-06-02 ENCOUNTER — Ambulatory Visit: Payer: BC Managed Care – PPO | Admitting: Psychology

## 2018-06-16 ENCOUNTER — Ambulatory Visit (INDEPENDENT_AMBULATORY_CARE_PROVIDER_SITE_OTHER): Payer: BC Managed Care – PPO | Admitting: Psychology

## 2018-06-16 DIAGNOSIS — F332 Major depressive disorder, recurrent severe without psychotic features: Secondary | ICD-10-CM

## 2018-06-30 ENCOUNTER — Ambulatory Visit: Payer: BC Managed Care – PPO | Admitting: Psychology

## 2018-07-28 ENCOUNTER — Ambulatory Visit (INDEPENDENT_AMBULATORY_CARE_PROVIDER_SITE_OTHER): Payer: BC Managed Care – PPO | Admitting: Psychology

## 2018-07-28 DIAGNOSIS — F332 Major depressive disorder, recurrent severe without psychotic features: Secondary | ICD-10-CM

## 2018-08-11 ENCOUNTER — Ambulatory Visit (INDEPENDENT_AMBULATORY_CARE_PROVIDER_SITE_OTHER): Payer: BC Managed Care – PPO | Admitting: Psychology

## 2018-08-11 DIAGNOSIS — F332 Major depressive disorder, recurrent severe without psychotic features: Secondary | ICD-10-CM | POA: Diagnosis not present

## 2018-08-25 ENCOUNTER — Ambulatory Visit (INDEPENDENT_AMBULATORY_CARE_PROVIDER_SITE_OTHER): Payer: BC Managed Care – PPO | Admitting: Psychology

## 2018-08-25 DIAGNOSIS — F332 Major depressive disorder, recurrent severe without psychotic features: Secondary | ICD-10-CM | POA: Diagnosis not present

## 2018-09-08 ENCOUNTER — Ambulatory Visit (INDEPENDENT_AMBULATORY_CARE_PROVIDER_SITE_OTHER): Payer: BC Managed Care – PPO | Admitting: Psychology

## 2018-09-08 DIAGNOSIS — F332 Major depressive disorder, recurrent severe without psychotic features: Secondary | ICD-10-CM | POA: Diagnosis not present

## 2018-09-22 ENCOUNTER — Ambulatory Visit (INDEPENDENT_AMBULATORY_CARE_PROVIDER_SITE_OTHER): Payer: BC Managed Care – PPO | Admitting: Psychology

## 2018-09-22 DIAGNOSIS — F332 Major depressive disorder, recurrent severe without psychotic features: Secondary | ICD-10-CM

## 2018-10-06 ENCOUNTER — Ambulatory Visit (INDEPENDENT_AMBULATORY_CARE_PROVIDER_SITE_OTHER): Payer: BC Managed Care – PPO | Admitting: Psychology

## 2018-10-06 DIAGNOSIS — F332 Major depressive disorder, recurrent severe without psychotic features: Secondary | ICD-10-CM

## 2018-10-20 ENCOUNTER — Ambulatory Visit: Payer: BC Managed Care – PPO | Admitting: Psychology

## 2018-11-03 ENCOUNTER — Ambulatory Visit (INDEPENDENT_AMBULATORY_CARE_PROVIDER_SITE_OTHER): Payer: BC Managed Care – PPO | Admitting: Psychology

## 2018-11-03 DIAGNOSIS — F332 Major depressive disorder, recurrent severe without psychotic features: Secondary | ICD-10-CM

## 2018-11-17 ENCOUNTER — Ambulatory Visit (INDEPENDENT_AMBULATORY_CARE_PROVIDER_SITE_OTHER): Payer: BC Managed Care – PPO | Admitting: Psychology

## 2018-11-17 DIAGNOSIS — F332 Major depressive disorder, recurrent severe without psychotic features: Secondary | ICD-10-CM

## 2018-12-01 ENCOUNTER — Ambulatory Visit: Payer: BC Managed Care – PPO | Admitting: Psychology

## 2018-12-15 ENCOUNTER — Ambulatory Visit (INDEPENDENT_AMBULATORY_CARE_PROVIDER_SITE_OTHER): Payer: BC Managed Care – PPO | Admitting: Psychology

## 2018-12-15 DIAGNOSIS — F332 Major depressive disorder, recurrent severe without psychotic features: Secondary | ICD-10-CM | POA: Diagnosis not present

## 2018-12-29 ENCOUNTER — Ambulatory Visit (INDEPENDENT_AMBULATORY_CARE_PROVIDER_SITE_OTHER): Payer: BC Managed Care – PPO | Admitting: Psychology

## 2018-12-29 DIAGNOSIS — F332 Major depressive disorder, recurrent severe without psychotic features: Secondary | ICD-10-CM | POA: Diagnosis not present

## 2019-01-12 ENCOUNTER — Ambulatory Visit (INDEPENDENT_AMBULATORY_CARE_PROVIDER_SITE_OTHER): Payer: BC Managed Care – PPO | Admitting: Psychology

## 2019-01-12 DIAGNOSIS — F332 Major depressive disorder, recurrent severe without psychotic features: Secondary | ICD-10-CM

## 2019-01-26 ENCOUNTER — Ambulatory Visit: Payer: BC Managed Care – PPO | Admitting: Psychology

## 2019-02-09 ENCOUNTER — Ambulatory Visit (INDEPENDENT_AMBULATORY_CARE_PROVIDER_SITE_OTHER): Payer: BC Managed Care – PPO | Admitting: Psychology

## 2019-02-09 DIAGNOSIS — F332 Major depressive disorder, recurrent severe without psychotic features: Secondary | ICD-10-CM

## 2019-02-23 ENCOUNTER — Ambulatory Visit (INDEPENDENT_AMBULATORY_CARE_PROVIDER_SITE_OTHER): Payer: BC Managed Care – PPO | Admitting: Psychology

## 2019-02-23 DIAGNOSIS — F332 Major depressive disorder, recurrent severe without psychotic features: Secondary | ICD-10-CM | POA: Diagnosis not present

## 2019-03-09 ENCOUNTER — Ambulatory Visit (INDEPENDENT_AMBULATORY_CARE_PROVIDER_SITE_OTHER): Payer: BC Managed Care – PPO | Admitting: Psychology

## 2019-03-09 DIAGNOSIS — F332 Major depressive disorder, recurrent severe without psychotic features: Secondary | ICD-10-CM

## 2019-04-06 ENCOUNTER — Ambulatory Visit (INDEPENDENT_AMBULATORY_CARE_PROVIDER_SITE_OTHER): Payer: BC Managed Care – PPO | Admitting: Psychology

## 2019-04-06 DIAGNOSIS — F332 Major depressive disorder, recurrent severe without psychotic features: Secondary | ICD-10-CM | POA: Diagnosis not present

## 2019-04-20 ENCOUNTER — Ambulatory Visit: Payer: BC Managed Care – PPO | Admitting: Psychology

## 2019-05-04 ENCOUNTER — Ambulatory Visit (INDEPENDENT_AMBULATORY_CARE_PROVIDER_SITE_OTHER): Payer: BC Managed Care – PPO | Admitting: Psychology

## 2019-05-04 DIAGNOSIS — F332 Major depressive disorder, recurrent severe without psychotic features: Secondary | ICD-10-CM

## 2019-05-18 ENCOUNTER — Ambulatory Visit: Payer: BC Managed Care – PPO | Admitting: Psychology

## 2019-06-01 ENCOUNTER — Ambulatory Visit (INDEPENDENT_AMBULATORY_CARE_PROVIDER_SITE_OTHER): Payer: BC Managed Care – PPO | Admitting: Psychology

## 2019-06-01 DIAGNOSIS — F332 Major depressive disorder, recurrent severe without psychotic features: Secondary | ICD-10-CM

## 2019-06-15 ENCOUNTER — Ambulatory Visit (INDEPENDENT_AMBULATORY_CARE_PROVIDER_SITE_OTHER): Payer: BC Managed Care – PPO | Admitting: Psychology

## 2019-06-15 DIAGNOSIS — F332 Major depressive disorder, recurrent severe without psychotic features: Secondary | ICD-10-CM

## 2019-06-29 ENCOUNTER — Ambulatory Visit (INDEPENDENT_AMBULATORY_CARE_PROVIDER_SITE_OTHER): Payer: BC Managed Care – PPO | Admitting: Psychology

## 2019-06-29 DIAGNOSIS — F332 Major depressive disorder, recurrent severe without psychotic features: Secondary | ICD-10-CM

## 2019-07-13 ENCOUNTER — Ambulatory Visit: Payer: BC Managed Care – PPO | Admitting: Psychology

## 2019-07-27 ENCOUNTER — Ambulatory Visit (INDEPENDENT_AMBULATORY_CARE_PROVIDER_SITE_OTHER): Payer: BC Managed Care – PPO | Admitting: Psychology

## 2019-07-27 DIAGNOSIS — F332 Major depressive disorder, recurrent severe without psychotic features: Secondary | ICD-10-CM

## 2019-08-10 ENCOUNTER — Ambulatory Visit: Payer: BC Managed Care – PPO | Admitting: Psychology

## 2019-08-24 ENCOUNTER — Ambulatory Visit (INDEPENDENT_AMBULATORY_CARE_PROVIDER_SITE_OTHER): Payer: BC Managed Care – PPO | Admitting: Psychology

## 2019-08-24 DIAGNOSIS — F332 Major depressive disorder, recurrent severe without psychotic features: Secondary | ICD-10-CM

## 2019-09-07 ENCOUNTER — Ambulatory Visit: Payer: BC Managed Care – PPO | Admitting: Psychology

## 2019-09-21 ENCOUNTER — Ambulatory Visit (INDEPENDENT_AMBULATORY_CARE_PROVIDER_SITE_OTHER): Payer: BC Managed Care – PPO | Admitting: Psychology

## 2019-09-21 DIAGNOSIS — F332 Major depressive disorder, recurrent severe without psychotic features: Secondary | ICD-10-CM

## 2019-10-05 ENCOUNTER — Ambulatory Visit (INDEPENDENT_AMBULATORY_CARE_PROVIDER_SITE_OTHER): Payer: BC Managed Care – PPO | Admitting: Psychology

## 2019-10-05 DIAGNOSIS — F332 Major depressive disorder, recurrent severe without psychotic features: Secondary | ICD-10-CM | POA: Diagnosis not present

## 2019-10-19 ENCOUNTER — Ambulatory Visit (INDEPENDENT_AMBULATORY_CARE_PROVIDER_SITE_OTHER): Payer: BC Managed Care – PPO | Admitting: Psychology

## 2019-10-19 DIAGNOSIS — F332 Major depressive disorder, recurrent severe without psychotic features: Secondary | ICD-10-CM

## 2019-10-26 IMAGING — CT CT RENAL STONE PROTOCOL
2 of 4 series · 16 of 46 positions shown, 18 images · non-contrast
Comparison: Abdomen radiograph from 07/06/2011

CLINICAL DATA: 36-year-old female presenting with right abdominal
and flank pain with urinary frequency since last evening.
Microscopic hematuria.

EXAM:
CT ABDOMEN AND PELVIS WITHOUT CONTRAST
TECHNIQUE: Multidetector CT imaging of the abdomen and pelvis was performed
following the standard protocol without IV contrast.

[Series 2: axial st · axial · 0.82mm/px · z∈[-436,-31]mm · 13 of 89 slices shown, 15 images]
[im 4/89  soft-tissue]
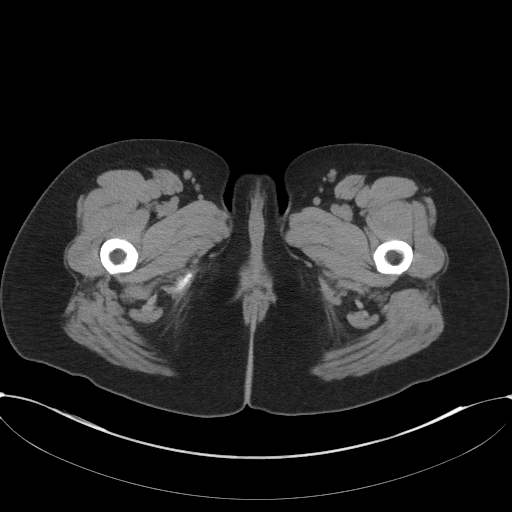
[im 4/89  bone]
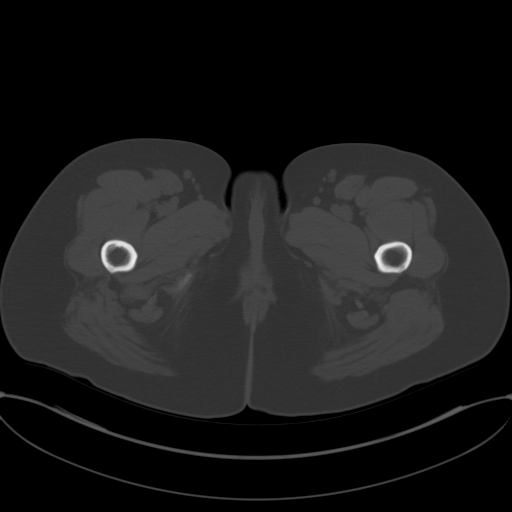
[im 12/89  soft-tissue]
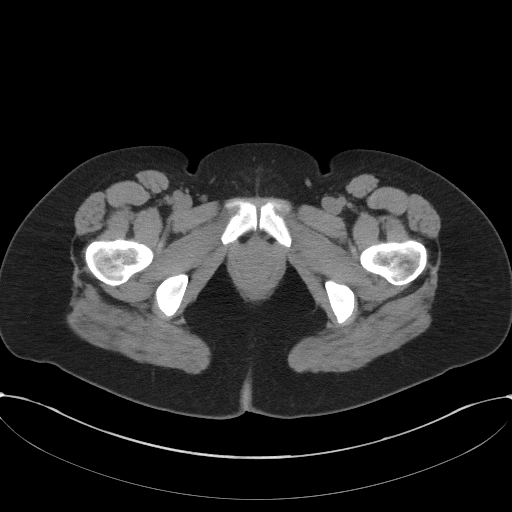
[im 20/89  soft-tissue]
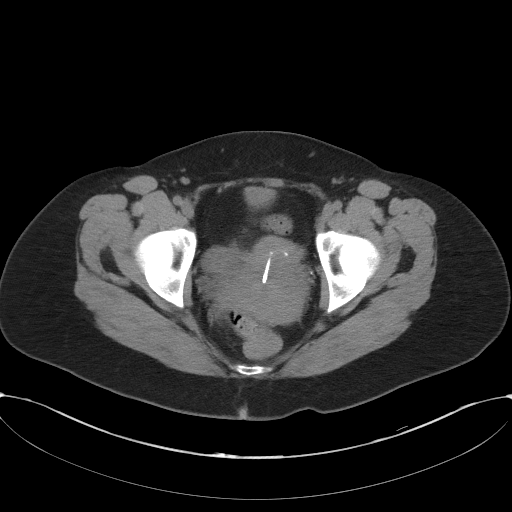
[im 23/89  soft-tissue]
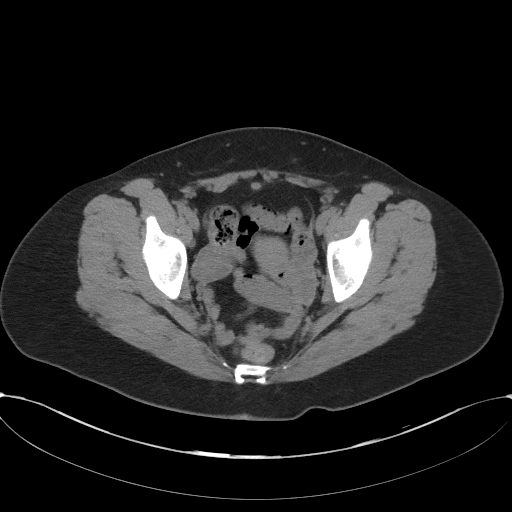
[im 31/89  soft-tissue]
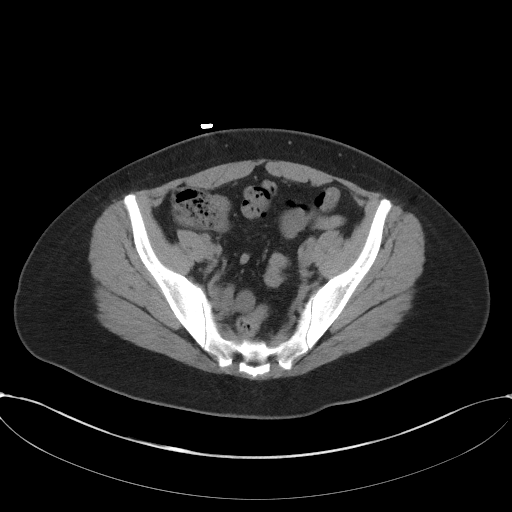
[im 39/89  soft-tissue]
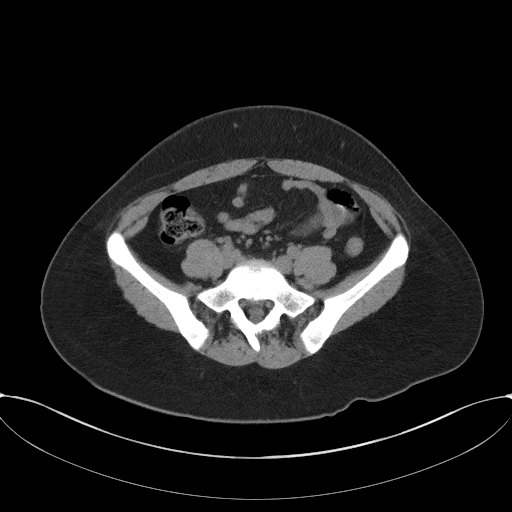
[im 46/89  soft-tissue]
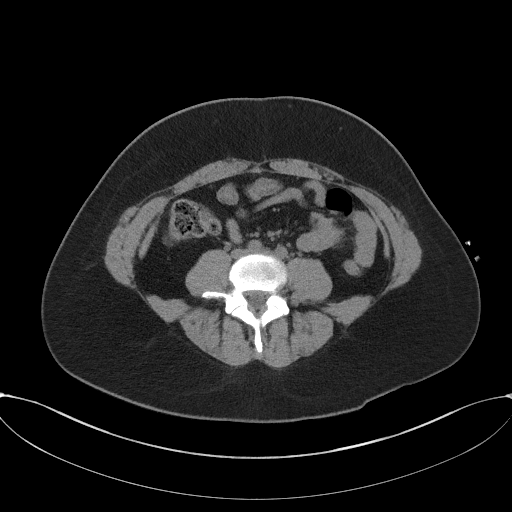
[im 50/89  soft-tissue]
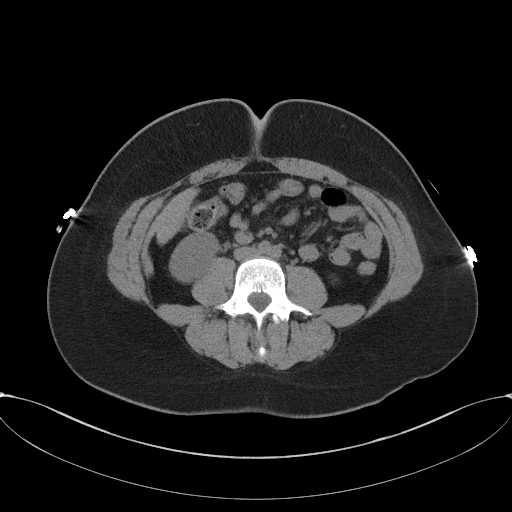
[im 58/89  soft-tissue]
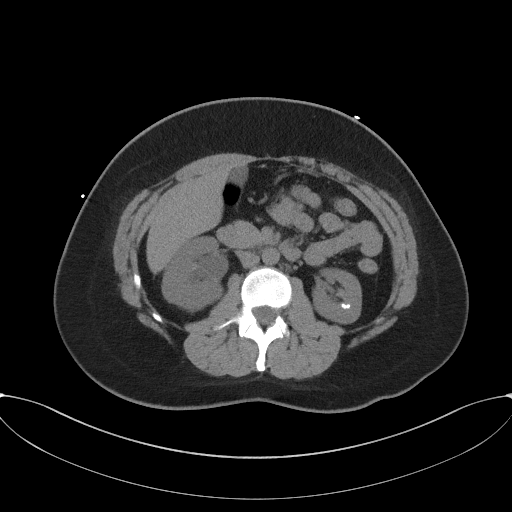
[im 58/89  bone]
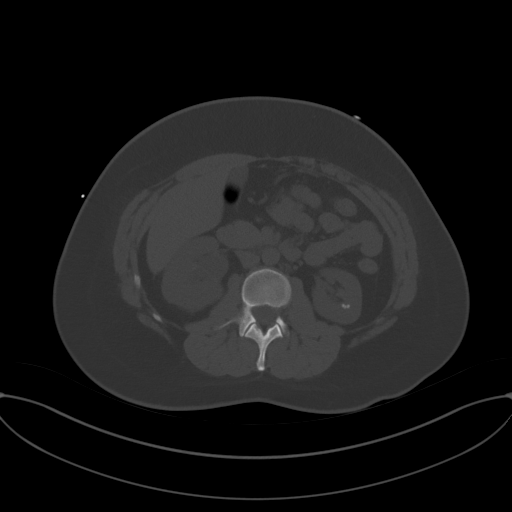
[im 66/89  soft-tissue]
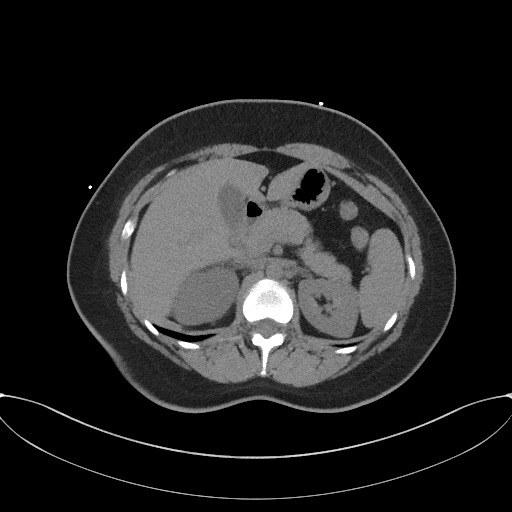
[im 69/89  soft-tissue]
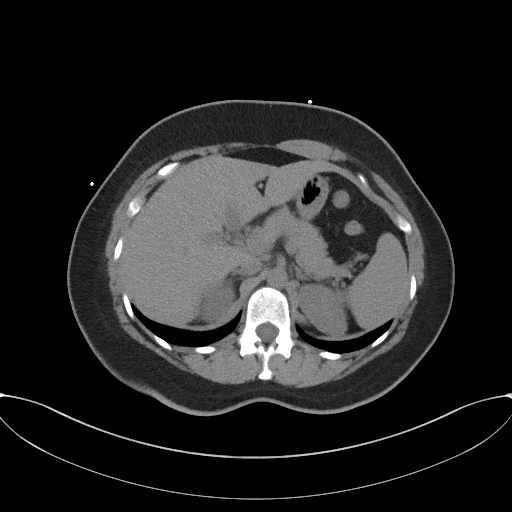
[im 77/89  soft-tissue]
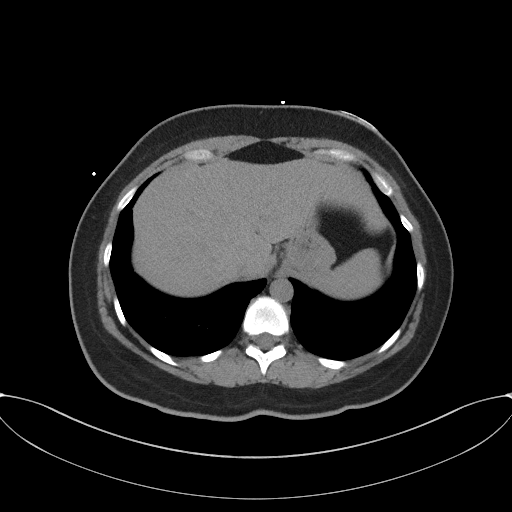
[im 85/89  soft-tissue]
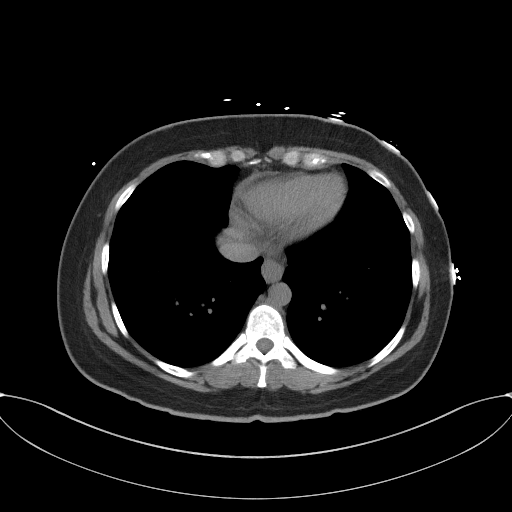

[Series 5: coronal st · coronal · 0.79mm/px · 3 of 98 slices shown]
[im 33/98  soft-tissue]
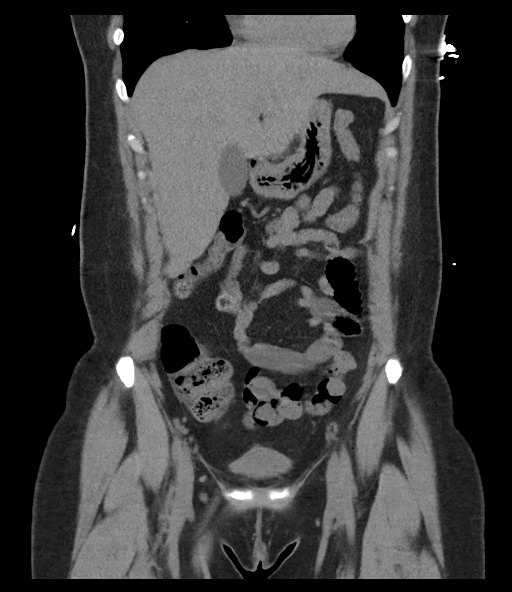
[im 44/98  soft-tissue]
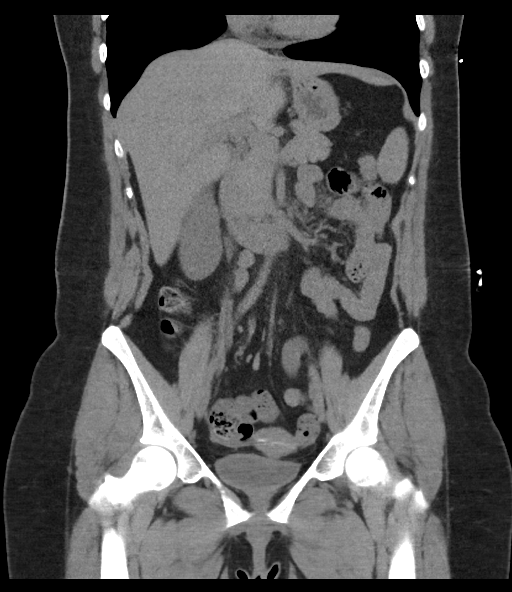
[im 54/98  soft-tissue]
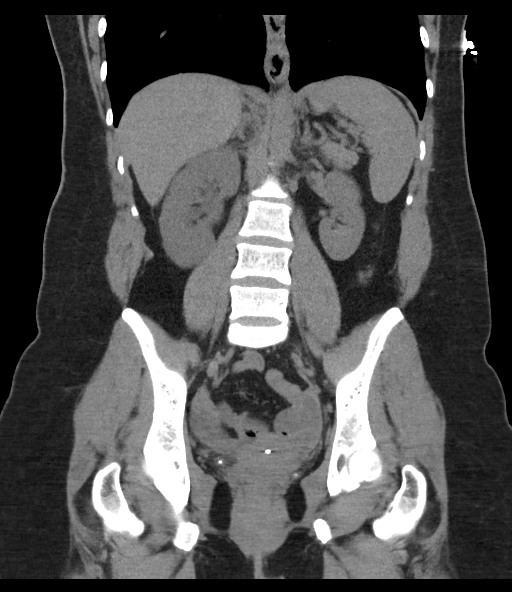

[16 of 46 positions shown; findings below may reference images not displayed]

FINDINGS: Lower chest: No acute abnormality. Small hiatal hernia. Clear lung
bases.

Hepatobiliary: No focal liver abnormality is seen. No gallstones,
gallbladder wall thickening, or biliary dilatation.

Pancreas: Unremarkable. No pancreatic ductal dilatation or
surrounding inflammatory changes.

Spleen: Normal in size without focal abnormality.

Adrenals/Urinary Tract: Normal bilateral adrenal glands. Mild
moderate right-sided hydroureteronephrosis secondary to a 3 mm
calculus in the distal right ureter approximately 1 cm from the UVJ.
Nonobstructing left-sided renal calculi are identified the largest
are in a cluster of 3 in the lower pole each approximately 4 mm in
diameter.

Stomach/Bowel: Stomach is within normal limits. Appendix appears
normal. No evidence of bowel wall thickening, distention, or
inflammatory changes.

Vascular/Lymphatic: No significant vascular findings are present. No
enlarged abdominal or pelvic lymph nodes.

Reproductive: Uterus and bilateral adnexa are unremarkable. IUD
within the expected location of the endometrium.

Other: No abdominal wall hernia or abnormality. No abdominopelvic
ascites.

Musculoskeletal: No acute or significant osseous findings.
IMPRESSION: 1. Distal right ureteral calculus measuring 3 mm with associated
mild-to-moderate right-sided hydroureteronephrosis. The calculus is
approximately 1 cm from the ureterovesical junction.
2. Left-sided nephrolithiasis without obstruction.

## 2019-11-02 ENCOUNTER — Ambulatory Visit: Payer: BC Managed Care – PPO | Admitting: Psychology

## 2019-11-16 ENCOUNTER — Ambulatory Visit (INDEPENDENT_AMBULATORY_CARE_PROVIDER_SITE_OTHER): Payer: BC Managed Care – PPO | Admitting: Psychology

## 2019-11-16 DIAGNOSIS — F332 Major depressive disorder, recurrent severe without psychotic features: Secondary | ICD-10-CM

## 2019-11-30 ENCOUNTER — Ambulatory Visit: Payer: BC Managed Care – PPO | Admitting: Psychology

## 2019-12-28 ENCOUNTER — Ambulatory Visit (INDEPENDENT_AMBULATORY_CARE_PROVIDER_SITE_OTHER): Payer: BC Managed Care – PPO | Admitting: Psychology

## 2019-12-28 DIAGNOSIS — F332 Major depressive disorder, recurrent severe without psychotic features: Secondary | ICD-10-CM

## 2020-01-11 ENCOUNTER — Ambulatory Visit (INDEPENDENT_AMBULATORY_CARE_PROVIDER_SITE_OTHER): Payer: BC Managed Care – PPO | Admitting: Psychology

## 2020-01-11 DIAGNOSIS — F332 Major depressive disorder, recurrent severe without psychotic features: Secondary | ICD-10-CM | POA: Diagnosis not present

## 2020-01-25 ENCOUNTER — Ambulatory Visit (INDEPENDENT_AMBULATORY_CARE_PROVIDER_SITE_OTHER): Payer: BC Managed Care – PPO | Admitting: Psychology

## 2020-01-25 DIAGNOSIS — F332 Major depressive disorder, recurrent severe without psychotic features: Secondary | ICD-10-CM

## 2020-02-08 ENCOUNTER — Ambulatory Visit: Payer: BC Managed Care – PPO | Admitting: Psychology

## 2020-02-22 ENCOUNTER — Ambulatory Visit: Payer: BC Managed Care – PPO | Admitting: Psychology

## 2020-03-07 ENCOUNTER — Ambulatory Visit (INDEPENDENT_AMBULATORY_CARE_PROVIDER_SITE_OTHER): Payer: BC Managed Care – PPO | Admitting: Psychology

## 2020-03-07 DIAGNOSIS — F332 Major depressive disorder, recurrent severe without psychotic features: Secondary | ICD-10-CM

## 2020-03-24 ENCOUNTER — Ambulatory Visit: Payer: BC Managed Care – PPO | Admitting: Podiatry

## 2020-03-24 ENCOUNTER — Other Ambulatory Visit: Payer: Self-pay

## 2020-03-24 ENCOUNTER — Encounter: Payer: Self-pay | Admitting: Podiatry

## 2020-03-24 DIAGNOSIS — B351 Tinea unguium: Secondary | ICD-10-CM | POA: Diagnosis not present

## 2020-03-24 NOTE — Patient Instructions (Signed)

## 2020-03-28 NOTE — Progress Notes (Signed)
Subjective:   Patient ID: Sharon Mt, female   DOB: 39 y.o.   MRN: [id number]   HPI 39 year old female presents the office today for concerns of her left big toenail having fungus.  She states that several years ago she was on Penlac and then recently she was on Lamisil.  She states it cleared up but not completely.  The nails not having any pain and there is no redness or drainage.  There is no other concerns today.   Review of Systems  All other systems reviewed and are negative.  Past Medical History:  Diagnosis Date  . Allergy   . Asthma   . Migraines   . PCOS (polycystic ovarian syndrome)     Past Surgical History:  Procedure Laterality Date  . WISDOM TOOTH EXTRACTION       Current Outpatient Medications:  .  doxycycline (VIBRA-TABS) 100 MG tablet, Take 100 mg by mouth 2 (two) times daily., Disp: , Rfl:  .  EPIPEN 2-PAK 0.3 MG/0.3ML SOAJ injection, INJECT 1 DOSE INTO THE MUSCLE AS DIRECTED BY MD, Disp: 2 Device, Rfl: 1 .  escitalopram (LEXAPRO) 5 MG tablet, Take 5 mg by mouth daily., Disp: , Rfl:  .  ibuprofen (ADVIL,MOTRIN) 600 MG tablet, Take 1 tablet (600 mg total) by mouth every 6 (six) hours as needed., Disp: 30 tablet, Rfl: 0 .  ibuprofen (ADVIL,MOTRIN) 600 MG tablet, Take 1 tablet (600 mg total) by mouth every 6 (six) hours as needed., Disp: 30 tablet, Rfl: 0 .  Levonorgestrel (SKYLA) 13.5 MG IUD, by Intrauterine route., Disp: , Rfl:  .  methocarbamol (ROBAXIN) 500 MG tablet, Take 1 tablet (500 mg total) by mouth 2 (two) times daily as needed for muscle spasms., Disp: 10 tablet, Rfl: 0 .  ondansetron (ZOFRAN ODT) 4 MG disintegrating tablet, Take 1 tablet (4 mg total) by mouth every 6 (six) hours as needed for nausea or vomiting., Disp: 20 tablet, Rfl: 0 .  oxyCODONE-acetaminophen (PERCOCET/ROXICET) 5-325 MG tablet, Take 1-2 tablets by mouth every 6 (six) hours as needed for severe pain., Disp: 20 tablet, Rfl: 0 .  Probiotic Product (PROBIOTIC & ACIDOPHILUS EX ST PO),  Take by mouth., Disp: , Rfl:  .  tamsulosin (FLOMAX) 0.4 MG CAPS capsule, Take 1 capsule (0.4 mg total) by mouth daily., Disp: 20 capsule, Rfl: 0  Allergies  Allergen Reactions  . Flagyl [Metronidazole] Shortness Of Breath  . Latex   . Camila [Norethindrone]   . Diflucan [Fluconazole]   . Medroxyprogesterone   . Proctosol [Hydrocortisone]   . Sprintec 28 [Norgestimate-Eth Estradiol] Other (See Comments)    Suicidal thoughts          Objective:  Physical Exam  General: AAO x3, NAD  Dermatological: Left hallux toenails hypertrophic, dystrophic with yellow-brown discoloration.  No hyperpigmentation present.  There is no edema, erythema to the toenail sites.  No open lesions.  Vascular: Dorsalis Pedis artery and Posterior Tibial artery pedal pulses are 2/4 bilateral with immedate capillary fill time.  There is no pain with calf compression, swelling, warmth, erythema.   Neruologic: Grossly intact via light touch bilateral.   Musculoskeletal: No gross boney pedal deformities bilateral. No pain, crepitus, or limitation noted with foot and ankle range of motion bilateral. Muscular strength 5/5 in all groups tested bilateral.  Gait: Unassisted, Nonantalgic.      Assessment:   Onychomycosis left hallux toenail    Plan:  -Treatment options discussed including all alternatives, risks, and complications -Etiology of symptoms were discussed -  At this time she is recently been on Lamisil I did debride the nails of this for culture, pathology to [facility] labs.  Discussed further treatments following the results of the fungus culture before proceeding with definitive treatment.  Vivi Barrack DPM

## 2020-04-04 ENCOUNTER — Ambulatory Visit (INDEPENDENT_AMBULATORY_CARE_PROVIDER_SITE_OTHER): Payer: BC Managed Care – PPO | Admitting: Psychology

## 2020-04-04 DIAGNOSIS — F332 Major depressive disorder, recurrent severe without psychotic features: Secondary | ICD-10-CM | POA: Diagnosis not present

## 2020-04-17 ENCOUNTER — Telehealth: Payer: Self-pay

## 2020-04-17 NOTE — Telephone Encounter (Signed)
Pt called back for her test results and stated that its ok to leave her a voicemail of the result.

## 2020-04-18 ENCOUNTER — Ambulatory Visit (INDEPENDENT_AMBULATORY_CARE_PROVIDER_SITE_OTHER): Payer: BC Managed Care – PPO | Admitting: Psychology

## 2020-04-18 DIAGNOSIS — F332 Major depressive disorder, recurrent severe without psychotic features: Secondary | ICD-10-CM

## 2020-04-19 ENCOUNTER — Telehealth (INDEPENDENT_AMBULATORY_CARE_PROVIDER_SITE_OTHER): Payer: BC Managed Care – PPO | Admitting: Podiatry

## 2020-04-19 NOTE — Telephone Encounter (Signed)
I called patient back to review results  I ordered the compound medication through [place] for onychomycosis (excluding fluconazole due to allergies). Faxed.

## 2020-04-19 NOTE — Telephone Encounter (Signed)
Patient missed Dr. Gabriel Rung call. Would like a call back at [phone]. [place] results are scanned in Media section.

## 2020-05-02 ENCOUNTER — Ambulatory Visit (INDEPENDENT_AMBULATORY_CARE_PROVIDER_SITE_OTHER): Payer: BC Managed Care – PPO | Admitting: Psychology

## 2020-05-02 DIAGNOSIS — F332 Major depressive disorder, recurrent severe without psychotic features: Secondary | ICD-10-CM | POA: Diagnosis not present

## 2020-05-16 ENCOUNTER — Ambulatory Visit (INDEPENDENT_AMBULATORY_CARE_PROVIDER_SITE_OTHER): Payer: BC Managed Care – PPO | Admitting: Psychology

## 2020-05-16 DIAGNOSIS — F332 Major depressive disorder, recurrent severe without psychotic features: Secondary | ICD-10-CM

## 2020-05-30 ENCOUNTER — Ambulatory Visit (INDEPENDENT_AMBULATORY_CARE_PROVIDER_SITE_OTHER): Payer: BC Managed Care – PPO | Admitting: Psychology

## 2020-05-30 DIAGNOSIS — F332 Major depressive disorder, recurrent severe without psychotic features: Secondary | ICD-10-CM | POA: Diagnosis not present

## 2020-06-13 ENCOUNTER — Ambulatory Visit (INDEPENDENT_AMBULATORY_CARE_PROVIDER_SITE_OTHER): Payer: BC Managed Care – PPO | Admitting: Psychology

## 2020-06-13 DIAGNOSIS — F332 Major depressive disorder, recurrent severe without psychotic features: Secondary | ICD-10-CM | POA: Diagnosis not present

## 2020-06-27 ENCOUNTER — Ambulatory Visit: Payer: BC Managed Care – PPO | Admitting: Psychology

## 2020-07-11 ENCOUNTER — Ambulatory Visit: Payer: BC Managed Care – PPO | Admitting: Psychology

## 2020-07-25 ENCOUNTER — Ambulatory Visit (INDEPENDENT_AMBULATORY_CARE_PROVIDER_SITE_OTHER): Payer: BC Managed Care – PPO | Admitting: Psychology

## 2020-07-25 DIAGNOSIS — F332 Major depressive disorder, recurrent severe without psychotic features: Secondary | ICD-10-CM | POA: Diagnosis not present

## 2020-08-08 ENCOUNTER — Ambulatory Visit (INDEPENDENT_AMBULATORY_CARE_PROVIDER_SITE_OTHER): Payer: BC Managed Care – PPO | Admitting: Psychology

## 2020-08-08 DIAGNOSIS — F332 Major depressive disorder, recurrent severe without psychotic features: Secondary | ICD-10-CM | POA: Diagnosis not present

## 2020-08-22 ENCOUNTER — Ambulatory Visit: Payer: BC Managed Care – PPO | Admitting: Psychology

## 2020-09-05 ENCOUNTER — Ambulatory Visit: Payer: BC Managed Care – PPO | Admitting: Psychology

## 2020-09-05 ENCOUNTER — Ambulatory Visit (INDEPENDENT_AMBULATORY_CARE_PROVIDER_SITE_OTHER): Payer: BC Managed Care – PPO | Admitting: Psychology

## 2020-09-05 DIAGNOSIS — F332 Major depressive disorder, recurrent severe without psychotic features: Secondary | ICD-10-CM

## 2020-09-19 ENCOUNTER — Ambulatory Visit: Payer: BC Managed Care – PPO | Admitting: Psychology

## 2020-10-03 ENCOUNTER — Ambulatory Visit: Payer: BC Managed Care – PPO | Admitting: Psychology

## 2020-10-03 ENCOUNTER — Ambulatory Visit (INDEPENDENT_AMBULATORY_CARE_PROVIDER_SITE_OTHER): Payer: BC Managed Care – PPO | Admitting: Psychology

## 2020-10-03 DIAGNOSIS — F332 Major depressive disorder, recurrent severe without psychotic features: Secondary | ICD-10-CM | POA: Diagnosis not present

## 2020-10-17 ENCOUNTER — Ambulatory Visit: Payer: BC Managed Care – PPO | Admitting: Psychology

## 2020-10-31 ENCOUNTER — Ambulatory Visit: Payer: BC Managed Care – PPO | Admitting: Psychology

## 2020-11-14 ENCOUNTER — Ambulatory Visit: Payer: BC Managed Care – PPO | Admitting: Psychology

## 2020-11-28 ENCOUNTER — Ambulatory Visit (INDEPENDENT_AMBULATORY_CARE_PROVIDER_SITE_OTHER): Payer: BC Managed Care – PPO | Admitting: Psychology

## 2020-11-28 ENCOUNTER — Ambulatory Visit: Payer: BC Managed Care – PPO | Admitting: Psychology

## 2020-11-28 DIAGNOSIS — F332 Major depressive disorder, recurrent severe without psychotic features: Secondary | ICD-10-CM | POA: Diagnosis not present

## 2020-12-26 ENCOUNTER — Ambulatory Visit: Payer: BC Managed Care – PPO | Admitting: Psychology

## 2021-01-09 ENCOUNTER — Ambulatory Visit: Payer: BC Managed Care – PPO | Admitting: Psychology

## 2021-01-23 ENCOUNTER — Ambulatory Visit (INDEPENDENT_AMBULATORY_CARE_PROVIDER_SITE_OTHER): Payer: BC Managed Care – PPO | Admitting: Psychology

## 2021-01-23 ENCOUNTER — Ambulatory Visit: Payer: BC Managed Care – PPO | Admitting: Psychology

## 2021-01-23 DIAGNOSIS — F332 Major depressive disorder, recurrent severe without psychotic features: Secondary | ICD-10-CM | POA: Diagnosis not present

## 2021-02-20 ENCOUNTER — Ambulatory Visit: Payer: BC Managed Care – PPO | Admitting: Psychology

## 2021-04-17 ENCOUNTER — Ambulatory Visit: Payer: BC Managed Care – PPO | Admitting: Psychology

## 2021-05-15 ENCOUNTER — Ambulatory Visit: Payer: BC Managed Care – PPO | Admitting: Psychology
